# Patient Record
Sex: Female | Born: 1975 | Hispanic: Yes | Marital: Married | Smoking: Former smoker
Health system: Southern US, Community
[De-identification: ages and names within clinical notes are randomized; demographics above are authoritative.]

## PROBLEM LIST (undated history)

## (undated) DIAGNOSIS — G4730 Sleep apnea, unspecified: Secondary | ICD-10-CM

## (undated) DIAGNOSIS — Z6832 Body mass index (BMI) 32.0-32.9, adult: Secondary | ICD-10-CM

## (undated) DIAGNOSIS — F4310 Post-traumatic stress disorder, unspecified: Secondary | ICD-10-CM

## (undated) DIAGNOSIS — Z8619 Personal history of other infectious and parasitic diseases: Secondary | ICD-10-CM

## (undated) DIAGNOSIS — R42 Dizziness and giddiness: Secondary | ICD-10-CM

## (undated) DIAGNOSIS — J45909 Unspecified asthma, uncomplicated: Secondary | ICD-10-CM

## (undated) DIAGNOSIS — F53 Postpartum depression: Secondary | ICD-10-CM

## (undated) DIAGNOSIS — O99345 Other mental disorders complicating the puerperium: Secondary | ICD-10-CM

## (undated) DIAGNOSIS — G43909 Migraine, unspecified, not intractable, without status migrainosus: Secondary | ICD-10-CM

## (undated) DIAGNOSIS — O09529 Supervision of elderly multigravida, unspecified trimester: Secondary | ICD-10-CM

## (undated) DIAGNOSIS — E049 Nontoxic goiter, unspecified: Secondary | ICD-10-CM

## (undated) HISTORY — DX: Migraine, unspecified, not intractable, without status migrainosus: G43.909

## (undated) HISTORY — DX: Supervision of elderly multigravida, unspecified trimester: O09.529

## (undated) HISTORY — DX: Post-traumatic stress disorder, unspecified: F43.10

## (undated) HISTORY — PX: IUD REMOVAL: SHX5392

## (undated) HISTORY — DX: Personal history of other infectious and parasitic diseases: Z86.19

## (undated) HISTORY — PX: HERNIA REPAIR: SHX51

## (undated) HISTORY — DX: Nontoxic goiter, unspecified: E04.9

## (undated) HISTORY — DX: Body mass index (bmi) 32.0-32.9, adult: Z68.32

## (undated) HISTORY — PX: COLONOSCOPY: SHX174

## (undated) HISTORY — PX: SPINAL CORD STIMULATOR IMPLANT: SHX2422

---

## 2000-02-02 DIAGNOSIS — R1110 Vomiting, unspecified: Secondary | ICD-10-CM

## 2003-12-01 ENCOUNTER — Inpatient Hospital Stay: Payer: Self-pay | Admitting: Unknown Physician Specialty

## 2004-05-28 ENCOUNTER — Emergency Department: Payer: Self-pay | Admitting: Emergency Medicine

## 2005-03-16 ENCOUNTER — Ambulatory Visit: Payer: Self-pay | Admitting: Internal Medicine

## 2005-11-12 ENCOUNTER — Emergency Department: Payer: Self-pay | Admitting: Emergency Medicine

## 2009-05-11 ENCOUNTER — Ambulatory Visit: Payer: Self-pay | Admitting: Internal Medicine

## 2010-10-07 ENCOUNTER — Emergency Department: Payer: Self-pay | Admitting: Emergency Medicine

## 2011-05-03 ENCOUNTER — Emergency Department: Payer: Self-pay | Admitting: Emergency Medicine

## 2012-09-22 ENCOUNTER — Ambulatory Visit: Payer: Self-pay | Admitting: Sports Medicine

## 2013-11-30 ENCOUNTER — Emergency Department: Payer: Self-pay | Admitting: Internal Medicine

## 2013-11-30 LAB — URINALYSIS, COMPLETE
Bilirubin,UR: NEGATIVE
Glucose,UR: NEGATIVE mg/dL (ref 0–75)
KETONE: NEGATIVE
Leukocyte Esterase: NEGATIVE
Nitrite: NEGATIVE
PH: 6 (ref 4.5–8.0)
Protein: NEGATIVE
RBC,UR: 1 /HPF (ref 0–5)
Specific Gravity: 1.014 (ref 1.003–1.030)
Squamous Epithelial: 1
WBC UR: 1 /HPF (ref 0–5)

## 2013-11-30 LAB — COMPREHENSIVE METABOLIC PANEL
ALBUMIN: 3.4 g/dL (ref 3.4–5.0)
ALT: 16 U/L
Alkaline Phosphatase: 43 U/L — ABNORMAL LOW
Anion Gap: 7 (ref 7–16)
BUN: 8 mg/dL (ref 7–18)
Bilirubin,Total: 0.5 mg/dL (ref 0.2–1.0)
CALCIUM: 8.3 mg/dL — AB (ref 8.5–10.1)
CO2: 26 mmol/L (ref 21–32)
Chloride: 110 mmol/L — ABNORMAL HIGH (ref 98–107)
Creatinine: 0.71 mg/dL (ref 0.60–1.30)
EGFR (Non-African Amer.): >60
GLUCOSE: 82 mg/dL (ref 65–99)
OSMOLALITY: 282 (ref 275–301)
POTASSIUM: 3.4 mmol/L — AB (ref 3.5–5.1)
SGOT(AST): 12 U/L — ABNORMAL LOW (ref 15–37)
SODIUM: 143 mmol/L (ref 136–145)
Total Protein: 7.7 g/dL (ref 6.4–8.2)

## 2013-11-30 LAB — CBC
HCT: 40.7 % (ref 35.0–47.0)
HGB: 13.5 g/dL (ref 12.0–16.0)
MCH: 30.7 pg (ref 26.0–34.0)
MCHC: 33.1 g/dL (ref 32.0–36.0)
MCV: 93 fL (ref 80–100)
Platelet: 121 10*3/uL — ABNORMAL LOW (ref 150–440)
RBC: 4.38 10*6/uL (ref 3.80–5.20)
RDW: 12.6 % (ref 11.5–14.5)
WBC: 5.6 10*3/uL (ref 3.6–11.0)

## 2013-11-30 LAB — LIPASE, BLOOD: Lipase: 171 U/L (ref 73–393)

## 2013-11-30 LAB — PREGNANCY, URINE: Pregnancy Test, Urine: NEGATIVE m[IU]/mL

## 2013-12-13 ENCOUNTER — Ambulatory Visit: Payer: Self-pay | Admitting: Family Medicine

## 2014-01-23 ENCOUNTER — Emergency Department: Payer: Self-pay | Admitting: Emergency Medicine

## 2014-01-23 LAB — CBC WITH DIFFERENTIAL/PLATELET
Basophil #: 0 10*3/uL (ref 0.0–0.1)
Basophil %: 0.7 %
EOS ABS: 0.2 10*3/uL (ref 0.0–0.7)
Eosinophil %: 3.6 %
HCT: 42.4 % (ref 35.0–47.0)
HGB: 13.9 g/dL (ref 12.0–16.0)
LYMPHS ABS: 1.7 10*3/uL (ref 1.0–3.6)
LYMPHS PCT: 27.5 %
MCH: 30.2 pg (ref 26.0–34.0)
MCHC: 32.7 g/dL (ref 32.0–36.0)
MCV: 92 fL (ref 80–100)
MONOS PCT: 6.5 %
Monocyte #: 0.4 x10 3/mm (ref 0.2–0.9)
NEUTROS ABS: 3.8 10*3/uL (ref 1.4–6.5)
Neutrophil %: 61.7 %
PLATELETS: 149 10*3/uL — AB (ref 150–440)
RBC: 4.6 10*6/uL (ref 3.80–5.20)
RDW: 12.5 % (ref 11.5–14.5)
WBC: 6.2 10*3/uL (ref 3.6–11.0)

## 2014-01-23 LAB — URINALYSIS, COMPLETE
BACTERIA: NONE SEEN
BILIRUBIN, UR: NEGATIVE
GLUCOSE, UR: NEGATIVE mg/dL (ref 0–75)
Ketone: NEGATIVE
Leukocyte Esterase: NEGATIVE
Nitrite: NEGATIVE
PH: 5 (ref 4.5–8.0)
Protein: NEGATIVE
RBC,UR: 1 /HPF (ref 0–5)
SPECIFIC GRAVITY: 1.01 (ref 1.003–1.030)
Squamous Epithelial: 3
WBC UR: <1 /HPF (ref 0–5)

## 2014-01-23 LAB — COMPREHENSIVE METABOLIC PANEL
ALBUMIN: 3.4 g/dL (ref 3.4–5.0)
Alkaline Phosphatase: 41 U/L — ABNORMAL LOW
Anion Gap: 8 (ref 7–16)
BUN: 8 mg/dL (ref 7–18)
Bilirubin,Total: 0.2 mg/dL (ref 0.2–1.0)
Calcium, Total: 8.1 mg/dL — ABNORMAL LOW (ref 8.5–10.1)
Chloride: 108 mmol/L — ABNORMAL HIGH (ref 98–107)
Co2: 22 mmol/L (ref 21–32)
Creatinine: 0.69 mg/dL (ref 0.60–1.30)
EGFR (African American): >60
EGFR (Non-African Amer.): >60
Glucose: 87 mg/dL (ref 65–99)
Osmolality: 273 (ref 275–301)
Potassium: 3.4 mmol/L — ABNORMAL LOW (ref 3.5–5.1)
SGOT(AST): 23 U/L (ref 15–37)
SGPT (ALT): 26 U/L
Sodium: 138 mmol/L (ref 136–145)
TOTAL PROTEIN: 7.5 g/dL (ref 6.4–8.2)

## 2014-01-23 LAB — GC/CHLAMYDIA PROBE AMP

## 2014-01-23 LAB — LIPASE, BLOOD: LIPASE: 209 U/L (ref 73–393)

## 2014-01-23 LAB — WET PREP, GENITAL

## 2014-02-07 ENCOUNTER — Ambulatory Visit: Payer: Self-pay | Admitting: Gastroenterology

## 2014-02-07 HISTORY — PX: ESOPHAGOGASTRODUODENOSCOPY ENDOSCOPY: SHX5814

## 2014-05-20 DIAGNOSIS — E669 Obesity, unspecified: Secondary | ICD-10-CM | POA: Insufficient documentation

## 2014-10-11 DIAGNOSIS — O021 Missed abortion: Secondary | ICD-10-CM

## 2014-10-15 ENCOUNTER — Encounter
Admission: RE | Admit: 2014-10-15 | Discharge: 2014-10-15 | Disposition: A | Discharge status: Home / Self Care | Payer: Medicaid Other | Source: Ambulatory Visit | Attending: Obstetrics and Gynecology | Admitting: Obstetrics and Gynecology

## 2014-10-15 DIAGNOSIS — O021 Missed abortion: Secondary | ICD-10-CM | POA: Diagnosis not present

## 2014-10-15 DIAGNOSIS — Z01818 Encounter for other preprocedural examination: Secondary | ICD-10-CM | POA: Insufficient documentation

## 2014-10-15 HISTORY — DX: Other mental disorders complicating the puerperium: O99.345

## 2014-10-15 HISTORY — DX: Dizziness and giddiness: R42

## 2014-10-15 HISTORY — DX: Unspecified asthma, uncomplicated: J45.909

## 2014-10-15 HISTORY — DX: Postpartum depression: F53.0

## 2014-10-15 LAB — CBC
HCT: 42.1 % (ref 35.0–47.0)
HEMOGLOBIN: 14.2 g/dL (ref 12.0–16.0)
MCH: 30.1 pg (ref 26.0–34.0)
MCHC: 33.7 g/dL (ref 32.0–36.0)
MCV: 89.5 fL (ref 80.0–100.0)
Platelets: 211 10*3/uL (ref 150–440)
RBC: 4.71 MIL/uL (ref 3.80–5.20)
RDW: 12.4 % (ref 11.5–14.5)
WBC: 9 10*3/uL (ref 3.6–11.0)

## 2014-10-15 LAB — ABO/RH: ABO/RH(D): O POS

## 2014-10-15 LAB — TYPE AND SCREEN
ABO/RH(D): O POS
ANTIBODY SCREEN: NEGATIVE

## 2014-10-15 NOTE — Patient Instructions (Signed)
  Your procedure is scheduled on: Thursday 10/16/14 Report to Day Surgery. 2ND FLOOR MEDICAL MALL ENTRANCE AT 3:30 To find out your arrival time please call [phone] between 1PM - 3PM on .  Remember: Instructions that are not followed completely may result in serious medical risk, up to and including death, or upon the discretion of your surgeon and anesthesiologist your surgery may need to be rescheduled.    __X__ 1. Do not eat food or drink liquids after midnight. No gum chewing or hard candies.     __X__ 2. No Alcohol for 24 hours before or after surgery.   ____ 3. Bring all medications with you on the day of surgery if instructed.    __X__ 4. Notify your doctor if there is any change in your medical condition     (cold, fever, infections).     Do not wear jewelry, make-up, hairpins, clips or nail polish.  Do not wear lotions, powders, or perfumes. You may wear deodorant.  Do not shave 48 hours prior to surgery. Men may shave face and neck.  Do not bring valuables to the hospital.    [facility] is not responsible for any belongings or valuables.               Contacts, dentures or bridgework may not be worn into surgery.  Leave your suitcase in the car. After surgery it may be brought to your room.  For patients admitted to the hospital, discharge time is determined by your                treatment team.   Patients discharged the day of surgery will not be allowed to drive home.   Please read over the following fact sheets that you were given:   Surgical Site Infection Prevention   __X__ Take these medicines the morning of surgery with A SIP OF WATER:    1.   2.   3.   4.  5.  6.  ____ Fleet Enema (as directed)   ____ Use CHG Soap as directed  ____ Use inhalers on the day of surgery  ____ Stop metformin 2 days prior to surgery    ____ Take 1/2 of usual insulin dose the night before surgery and none on the morning of surgery.   ____ Stop  Coumadin/Plavix/aspirin on   ____ Stop Anti-inflammatories on    ____ Stop supplements until after surgery.    ____ Bring C-Pap to the hospital.

## 2014-10-16 ENCOUNTER — Encounter: Payer: Self-pay | Admitting: *Deleted

## 2014-10-16 ENCOUNTER — Ambulatory Visit: Payer: Medicaid Other | Admitting: Anesthesiology

## 2014-10-16 ENCOUNTER — Ambulatory Visit
Admission: RE | Admit: 2014-10-16 | Discharge: 2014-10-16 | Disposition: A | Discharge status: Home / Self Care | Payer: Medicaid Other | Source: Ambulatory Visit | Attending: Obstetrics and Gynecology | Admitting: Obstetrics and Gynecology

## 2014-10-16 ENCOUNTER — Encounter
Admission: RE | Disposition: A | Discharge status: Home / Self Care | Payer: Self-pay | Source: Ambulatory Visit | Attending: Obstetrics and Gynecology

## 2014-10-16 DIAGNOSIS — J45909 Unspecified asthma, uncomplicated: Secondary | ICD-10-CM | POA: Diagnosis not present

## 2014-10-16 DIAGNOSIS — K219 Gastro-esophageal reflux disease without esophagitis: Secondary | ICD-10-CM | POA: Diagnosis not present

## 2014-10-16 DIAGNOSIS — Z3A01 Less than 8 weeks gestation of pregnancy: Secondary | ICD-10-CM | POA: Insufficient documentation

## 2014-10-16 DIAGNOSIS — F329 Major depressive disorder, single episode, unspecified: Secondary | ICD-10-CM | POA: Insufficient documentation

## 2014-10-16 DIAGNOSIS — Z9889 Other specified postprocedural states: Secondary | ICD-10-CM

## 2014-10-16 DIAGNOSIS — O021 Missed abortion: Secondary | ICD-10-CM | POA: Insufficient documentation

## 2014-10-16 HISTORY — PX: DILATION AND EVACUATION: SHX1459

## 2014-10-16 SURGERY — DILATION AND EVACUATION, UTERUS
Anesthesia: General | Site: Vagina | Wound class: Clean Contaminated

## 2014-10-16 MED ORDER — DOXYCYCLINE HYCLATE 100 MG PO TABS
200.0000 mg | ORAL_TABLET | Freq: Once | ORAL | Status: AC
  Administered 2014-10-16: 200 mg via ORAL
  Filled 2014-10-16: qty 2

## 2014-10-16 MED ORDER — FAMOTIDINE 20 MG PO TABS
20.0000 mg | ORAL_TABLET | Freq: Once | ORAL | Status: AC
  Administered 2014-10-16: 20 mg via ORAL

## 2014-10-16 MED ORDER — MIDAZOLAM HCL 2 MG/2ML IJ SOLN
INTRAMUSCULAR | Status: DC | PRN
  Administered 2014-10-16 (×4): 1 mg via INTRAVENOUS

## 2014-10-16 MED ORDER — LACTATED RINGERS IV SOLN
INTRAVENOUS | Status: DC
  Administered 2014-10-16 (×2): via INTRAVENOUS

## 2014-10-16 MED ORDER — FENTANYL CITRATE (PF) 100 MCG/2ML IJ SOLN
INTRAMUSCULAR | Status: AC
  Administered 2014-10-16: 25 ug via INTRAVENOUS
  Filled 2014-10-16: qty 2

## 2014-10-16 MED ORDER — FENTANYL CITRATE (PF) 100 MCG/2ML IJ SOLN
INTRAMUSCULAR | Status: DC | PRN
  Administered 2014-10-16: 50 ug via INTRAVENOUS

## 2014-10-16 MED ORDER — OXYCODONE HCL 5 MG PO TABS
5.0000 mg | ORAL_TABLET | Freq: Once | ORAL | Status: AC | PRN
  Administered 2014-10-16: 5 mg via ORAL

## 2014-10-16 MED ORDER — LIDOCAINE HCL (CARDIAC) 20 MG/ML IV SOLN
INTRAVENOUS | Status: DC | PRN
  Administered 2014-10-16: 30 mg via INTRAVENOUS

## 2014-10-16 MED ORDER — OXYCODONE HCL 5 MG/5ML PO SOLN: 5.0000 mg | Freq: Once | ORAL | Status: AC | PRN

## 2014-10-16 MED ORDER — FAMOTIDINE 20 MG PO TABS
ORAL_TABLET | ORAL | Status: AC
Start: 2014-10-16 — End: 2014-10-16
  Administered 2014-10-16: 20 mg via ORAL
  Filled 2014-10-16: qty 1

## 2014-10-16 MED ORDER — OXYCODONE HCL 5 MG PO TABS
ORAL_TABLET | ORAL | Status: AC
  Filled 2014-10-16: qty 1

## 2014-10-16 MED ORDER — FENTANYL CITRATE (PF) 100 MCG/2ML IJ SOLN
25.0000 ug | INTRAMUSCULAR | Status: DC | PRN
  Administered 2014-10-16: 25 ug via INTRAVENOUS
  Administered 2014-10-16: 50 ug via INTRAVENOUS
  Administered 2014-10-16: 25 ug via INTRAVENOUS

## 2014-10-16 MED ORDER — IBUPROFEN 600 MG PO TABS: 600.0000 mg | ORAL_TABLET | Freq: Four times a day (QID) | ORAL | Status: DC | PRN

## 2014-10-16 MED ORDER — DOXYCYCLINE HYCLATE 100 MG IV SOLR
100.0000 mg | Freq: Once | INTRAVENOUS | Status: AC
  Administered 2014-10-16: 100 mg via INTRAVENOUS
  Administered 2014-10-16: 15:00:00 via INTRAVENOUS
  Filled 2014-10-16: qty 100

## 2014-10-16 MED ORDER — PROPOFOL 10 MG/ML IV BOLUS
INTRAVENOUS | Status: DC | PRN
  Administered 2014-10-16: 150 mg via INTRAVENOUS
  Administered 2014-10-16: 50 mg via INTRAVENOUS

## 2014-10-16 MED ORDER — HYDROCODONE-ACETAMINOPHEN 5-325 MG PO TABS: 1.0000 | ORAL_TABLET | Freq: Four times a day (QID) | ORAL | Status: DC | PRN

## 2014-10-16 SURGICAL SUPPLY — 18 items
CATH ROBINSON RED A/P 16FR (CATHETERS) ×3 IMPLANT
FILTER UTR ASPR SPEC (MISCELLANEOUS) ×1 IMPLANT
FLTR UTR ASPR SPEC (MISCELLANEOUS) ×3
GLOVE BIO SURGEON STRL SZ7 (GLOVE) ×6 IMPLANT
GOWN STRL REUS W/ TWL LRG LVL3 (GOWN DISPOSABLE) ×2 IMPLANT
GOWN STRL REUS W/TWL LRG LVL3 (GOWN DISPOSABLE) ×4
KIT BERKELEY 1ST TRIMESTER 3/8 (MISCELLANEOUS) ×3 IMPLANT
KIT RM TURNOVER CYSTO AR (KITS) ×3 IMPLANT
NS IRRIG 500ML POUR BTL (IV SOLUTION) ×3 IMPLANT
PACK DNC HYST (MISCELLANEOUS) ×3 IMPLANT
PAD OB MATERNITY 4.3X12.25 (PERSONAL CARE ITEMS) ×3 IMPLANT
PAD PREP 24X41 OB/GYN DISP (PERSONAL CARE ITEMS) ×3 IMPLANT
SET BERKELEY SUCTION TUBING (SUCTIONS) ×3 IMPLANT
TOWEL OR 17X26 4PK STRL BLUE (TOWEL DISPOSABLE) ×3 IMPLANT
VACURETTE 10 RIGID CVD (CANNULA) IMPLANT
VACURETTE 12 RIGID CVD (CANNULA) IMPLANT
VACURETTE 8 RIGID CVD (CANNULA) IMPLANT
VACURETTE 8MM F TIP (MISCELLANEOUS) ×3 IMPLANT

## 2014-10-16 NOTE — Anesthesia Preprocedure Evaluation (Addendum)
Anesthesia Evaluation  Patient identified by MRN, date of birth, ID band Patient awake    Reviewed: Allergy & Precautions, H&P , NPO status , Patient's Chart, lab work & pertinent test results  Airway Mallampati: II  TM Distance: >3 FB Neck ROM: full    Dental  (+) Poor Dentition   Pulmonary neg shortness of breath, asthma , former smoker,    Pulmonary exam normal breath sounds clear to auscultation       Cardiovascular Exercise Tolerance: Good (-) Past MI negative cardio ROS Normal cardiovascular exam Rhythm:regular Rate:Normal     Neuro/Psych PSYCHIATRIC DISORDERS Depression negative neurological ROS  negative psych ROS   GI/Hepatic Neg liver ROS, GERD  Controlled,  Endo/Other  negative endocrine ROS  Renal/GU negative Renal ROS  negative genitourinary   Musculoskeletal   Abdominal   Peds  Hematology negative hematology ROS (+)   Anesthesia Other Findings Past Medical History:   Asthma                                                       Vertigo - worse this morning per patient which she attributes to be NPO         Post partum depression                                      Reproductive/Obstetrics negative OB ROS                            Anesthesia Physical Anesthesia Plan  ASA: III  Anesthesia Plan: General LMA   Post-op Pain Management:    Induction:   Airway Management Planned:   Additional Equipment:   Intra-op Plan:   Post-operative Plan:   Informed Consent: I have reviewed the patients History and Physical, chart, labs and discussed the procedure including the risks, benefits and alternatives for the proposed anesthesia with the patient or authorized representative who has indicated his/her understanding and acceptance.   Dental Advisory Given  Plan Discussed with: Anesthesiologist, CRNA and Surgeon  Anesthesia Plan Comments:         Anesthesia Quick  Evaluation

## 2014-10-16 NOTE — Op Note (Signed)
Patient Name: Latasha Waters Date of Procedure: 10/16/2014  Preoperative Diagnosis: 1) 39 y.o. with 7 week missed abortion  Postoperative Diagnosis: 1) 39 y.o. with 7 week missed abortion  Operation Performed: Suction dilation and curettage  Indication: 7 week missed abortion choosing surgical management  Anesthesia: General  Primary Surgeon: Vena Austria, MD  Assistant: none  Preoperative Antibiotics: none  Estimated Blood Loss:  IV Fluids: crystaloid  Urine Output:: ~43mL straiggt cath  Drains or Tubes: none  Implants: none  Specimens Removed: products of conception  Complications: none  Intraoperative Findings:  8-10 week size uterus, sounded to 12cm, good uterine cry moderate amount of POC  Patient Condition: stable  Procedure in Detail:  Patient was taken to the operating room were she was administered general endotracheal anesthesia.  She was positioned in the dorsal lithotomy position utilizing Allen stirups, prepped and draped in the usual sterile fashion.  Uterus was noted to be 8-10 weeks in size, anteverted.   Prior to proceeding with the case a time out was performed.  Attention was turned to the patient's pelvis.  A red rubber catheter was used to empty the patient's bladder.  An operative speculum was placed to allow visualization of the cervix.  The anterior lip of the cervix was grasped with a single tooth tenaculum, uterus sounded to 12cm,  and the cervix was sequentially dilated using pratt dilators.  A size 8 flexible suction curette was used to evacuate the uterine cavity utilizing several passes.  Sharp curettage was performed noting good uterine cry.  A final pass of the suction curette was undertaken.  The single tooth tenaculum was removed there cervix and tenaculum sites were hemostatic before removing the operative speculum.  Sponge needle and instrument counts were corrects times two.  The patient tolerated the procedure well and  was taken to the recovery room in stable condition.

## 2014-10-16 NOTE — Transfer of Care (Signed)
Immediate Anesthesia Transfer of Care Note  Patient: Latasha Waters  Procedure(s) Performed: Procedure(s): DILATATION AND EVACUATION (N/A)  Patient Location: PACU  Anesthesia Type:General  Level of Consciousness: awake, oriented and patient cooperative  Airway & Oxygen Therapy: Patient Spontanous Breathing and Patient connected to face mask oxygen  Post-op Assessment: Report given to RN and Post -op Vital signs reviewed and stable  Post vital signs: Reviewed and stable  Last Vitals:  Filed Vitals:   10/16/14 1554  BP: 102/77  Pulse: 72  Temp: 36.2 C  Resp: 17    Complications: No apparent anesthesia complications

## 2014-10-16 NOTE — Discharge Instructions (Addendum)

## 2014-10-16 NOTE — Anesthesia Postprocedure Evaluation (Signed)
  Anesthesia Post-op Note  Patient: Latasha Waters  Procedure(s) Performed: Procedure(s): DILATATION AND EVACUATION (N/A)  Anesthesia type:General LMA  Patient location: PACU  Post pain: Pain level controlled  Post assessment: Post-op Vital signs reviewed, Patient's Cardiovascular Status Stable, Respiratory Function Stable, Patent Airway and No signs of Nausea or vomiting  Post vital signs: Reviewed and stable  Last Vitals:  Filed Vitals:   10/16/14 1750  BP: 119/85  Pulse: 86  Temp: 36.5 C  Resp: 16    Level of consciousness: awake, alert  and patient cooperative  Complications: No apparent anesthesia complications

## 2014-10-17 ENCOUNTER — Encounter: Payer: Self-pay | Admitting: Obstetrics and Gynecology

## 2014-10-21 LAB — SURGICAL PATHOLOGY

## 2016-01-07 ENCOUNTER — Emergency Department
Admission: EM | Admit: 2016-01-07 | Discharge: 2016-01-07 | Disposition: A | Discharge status: Home / Self Care | Payer: Medicaid Other | Attending: Emergency Medicine | Admitting: Emergency Medicine

## 2016-01-07 ENCOUNTER — Encounter: Payer: Self-pay | Admitting: Emergency Medicine

## 2016-01-07 DIAGNOSIS — Z791 Long term (current) use of non-steroidal anti-inflammatories (NSAID): Secondary | ICD-10-CM | POA: Diagnosis not present

## 2016-01-07 DIAGNOSIS — Z3A11 11 weeks gestation of pregnancy: Secondary | ICD-10-CM | POA: Diagnosis not present

## 2016-01-07 DIAGNOSIS — O23591 Infection of other part of genital tract in pregnancy, first trimester: Secondary | ICD-10-CM | POA: Insufficient documentation

## 2016-01-07 DIAGNOSIS — Z87891 Personal history of nicotine dependence: Secondary | ICD-10-CM | POA: Insufficient documentation

## 2016-01-07 DIAGNOSIS — N760 Acute vaginitis: Secondary | ICD-10-CM

## 2016-01-07 DIAGNOSIS — N9489 Other specified conditions associated with female genital organs and menstrual cycle: Secondary | ICD-10-CM | POA: Insufficient documentation

## 2016-01-07 DIAGNOSIS — J45909 Unspecified asthma, uncomplicated: Secondary | ICD-10-CM | POA: Insufficient documentation

## 2016-01-07 DIAGNOSIS — O26891 Other specified pregnancy related conditions, first trimester: Secondary | ICD-10-CM | POA: Diagnosis present

## 2016-01-07 LAB — COMPREHENSIVE METABOLIC PANEL
ALT: 20 U/L (ref 14–54)
AST: 22 U/L (ref 15–41)
Albumin: 3.5 g/dL (ref 3.5–5.0)
Alkaline Phosphatase: 38 U/L (ref 38–126)
Anion gap: 8 (ref 5–15)
BILIRUBIN TOTAL: 0.7 mg/dL (ref 0.3–1.2)
BUN: 7 mg/dL (ref 6–20)
CO2: 21 mmol/L — ABNORMAL LOW (ref 22–32)
Calcium: 8.8 mg/dL — ABNORMAL LOW (ref 8.9–10.3)
Chloride: 105 mmol/L (ref 101–111)
Creatinine, Ser: 0.54 mg/dL (ref 0.44–1.00)
Glucose, Bld: 95 mg/dL (ref 65–99)
POTASSIUM: 3.3 mmol/L — AB (ref 3.5–5.1)
Sodium: 134 mmol/L — ABNORMAL LOW (ref 135–145)
TOTAL PROTEIN: 7.8 g/dL (ref 6.5–8.1)

## 2016-01-07 LAB — URINALYSIS, COMPLETE (UACMP) WITH MICROSCOPIC
BILIRUBIN URINE: NEGATIVE
Glucose, UA: NEGATIVE mg/dL
Ketones, ur: 20 mg/dL — AB
NITRITE: NEGATIVE
PH: 5 (ref 5.0–8.0)
Protein, ur: 100 mg/dL — AB
SPECIFIC GRAVITY, URINE: 1.028 (ref 1.005–1.030)

## 2016-01-07 LAB — POCT PREGNANCY, URINE: Preg Test, Ur: POSITIVE — AB

## 2016-01-07 LAB — WET PREP, GENITAL
Clue Cells Wet Prep HPF POC: NONE SEEN
SPERM: NONE SEEN
Trich, Wet Prep: NONE SEEN
Yeast Wet Prep HPF POC: NONE SEEN

## 2016-01-07 LAB — CBC
HEMATOCRIT: 40.8 % (ref 35.0–47.0)
Hemoglobin: 13.9 g/dL (ref 12.0–16.0)
MCH: 29.1 pg (ref 26.0–34.0)
MCHC: 34.1 g/dL (ref 32.0–36.0)
MCV: 85.5 fL (ref 80.0–100.0)
PLATELETS: 230 10*3/uL (ref 150–440)
RBC: 4.77 MIL/uL (ref 3.80–5.20)
RDW: 12.8 % (ref 11.5–14.5)
WBC: 8.9 10*3/uL (ref 3.6–11.0)

## 2016-01-07 LAB — HCG, QUANTITATIVE, PREGNANCY: hCG, Beta Chain, Quant, S: 130972 m[IU]/mL — ABNORMAL HIGH (ref <5)

## 2016-01-07 MED ORDER — FLUCONAZOLE 50 MG PO TABS
150.0000 mg | ORAL_TABLET | ORAL | Status: AC
  Administered 2016-01-07: 150 mg via ORAL
  Filled 2016-01-07: qty 3

## 2016-01-07 MED ORDER — FLUCONAZOLE 150 MG PO TABS: ORAL_TABLET | ORAL | 0 refills | Status: DC

## 2016-01-07 NOTE — ED Provider Notes (Signed)
[facility] Emergency Department Provider Note  ____________________________________________   First MD Initiated Contact with Patient 01/07/16 1429     (approximate)  I have reviewed the triage vital signs and the nursing notes.   HISTORY  Chief Complaint Pelvic Pain    HPI Latasha Waters is a 40 y.o. female G3 P0 (2 miscarriages) at approximately [redacted] weeks gestation who is followed at [facility] by Dr. Chauncey CruelStabler.  She presents for evaluation of burning and itching of her labia.  She states that it started several days ago as she was completing a course of Keflex for a probable urinary Tract infection.  Been gradually getting worse since that time.  Nothing makes it better and movement and friction make it much worse. It is severely painful.  She denies fever/chills, chest pain, shortness of breath, nausea, vomiting, abdominal pain, vaginal bleeding.  She does have some burning pain when she urinates but that developed after all the sensitivity down in her "private parts".  Past Medical History:  Diagnosis Date  . Asthma   . Post partum depression   . Vertigo     There are no active problems to display for this patient.   Past Surgical History:  Procedure Laterality Date  . COLONOSCOPY    . DILATION AND EVACUATION N/A 10/16/2014   Procedure: DILATATION AND EVACUATION;  Surgeon: Vena AustriaAndreas Staebler, MD;  Location: [facility] ORS;  Service: Gynecology;  Laterality: N/A;  . HERNIA REPAIR    . IUD REMOVAL      Prior to Admission medications   Medication Sig Start Date End Date Taking? Authorizing Provider  fluconazole (DIFLUCAN) 150 MG tablet Take 1 tablet (150 mg) by mouth on 01/09/2016 if you still have symptoms. 01/07/16   Loleta Roseory Beronica Lansdale, MD  HYDROcodone-acetaminophen (NORCO/VICODIN) 5-325 MG tablet Take 1 tablet by mouth every 6 (six) hours as needed. 10/16/14   Vena AustriaAndreas Staebler, MD  ibuprofen (ADVIL,MOTRIN) 600 MG tablet Take 1 tablet (600 mg total) by mouth  every 6 (six) hours as needed. 10/16/14   Vena AustriaAndreas Staebler, MD    Allergies Patient has no known allergies.  No family history on file.  Social History Social History  Substance Use Topics  . Smoking status: Former Games developermoker  . Smokeless tobacco: Never Used  . Alcohol use No    Review of Systems Constitutional: No fever/chills Eyes: No visual changes. ENT: No sore throat. Cardiovascular: Denies chest pain. Respiratory: Denies shortness of breath. Gastrointestinal: No abdominal pain.  No nausea, no vomiting.  No diarrhea.  No constipation. Genitourinary: Negative for dysuria. Musculoskeletal: Negative for back pain. Skin: Negative for rash. Neurological: Negative for headaches, focal weakness or numbness.  10-point ROS otherwise negative.  ____________________________________________   PHYSICAL EXAM:  VITAL SIGNS: ED Triage Vitals  Enc Vitals Group     BP 01/07/16 1314 128/77     Pulse Rate 01/07/16 1314 (!) 104     Resp 01/07/16 1314 20     Temp 01/07/16 1314 98 F (36.7 C)     Temp Source 01/07/16 1314 Oral     SpO2 01/07/16 1314 99 %     Weight 01/07/16 1314 195 lb (88.5 kg)     Height --      Head Circumference --      Peak Flow --      Pain Score 01/07/16 1315 10     Pain Loc --      Pain Edu? --      Excl. in GC? --  Constitutional: Alert and oriented. Well appearing and in no acute distress. Eyes: Conjunctivae are normal. PERRL. EOMI. Head: Atraumatic. Nose: No congestion/rhinnorhea. Mouth/Throat: Mucous membranes are moist.  Oropharynx non-erythematous. Neck: No stridor.  No meningeal signs.   Cardiovascular: Normal rate, regular rhythm. Good peripheral circulation. Grossly normal heart sounds. Respiratory: Normal respiratory effort.  No retractions. Lungs CTAB. Gastrointestinal: Soft and nontender. No distention.  GU:  Red, raw, inflammed, irritated vulvar region with no discrete lesions including no vesicular lesions that would indicate herpes.   The entire area is extremely sensitive to the touch.  No obvious discharge.  Due to the extreme sensitivity I did not attempt a speculum exam but I did obtain a wet prep with gentle insertion of the collection device into the vaginal vault and around on the labia.  Nurse chaperone present throughout exam. Musculoskeletal: No lower extremity tenderness nor edema. No gross deformities of extremities. Neurologic:  Normal speech and language. No gross focal neurologic deficits are appreciated.  Skin:  Skin is warm, dry and intact. No rash noted. Psychiatric: Mood and affect are normal. Speech and behavior are normal.  ____________________________________________   LABS (all labs ordered are listed, but only abnormal results are displayed)  Labs Reviewed  WET PREP, GENITAL - Abnormal; Notable for the following:       Result Value   WBC, Wet Prep HPF POC FEW (*)    All other components within normal limits  COMPREHENSIVE METABOLIC PANEL - Abnormal; Notable for the following:    Sodium 134 (*)    Potassium 3.3 (*)    CO2 21 (*)    Calcium 8.8 (*)    All other components within normal limits  URINALYSIS, COMPLETE (UACMP) WITH MICROSCOPIC - Abnormal; Notable for the following:    Color, Urine AMBER (*)    APPearance CLOUDY (*)    Hgb urine dipstick MODERATE (*)    Ketones, ur 20 (*)    Protein, ur 100 (*)    Leukocytes, UA LARGE (*)    Bacteria, UA MANY (*)    Squamous Epithelial / LPF 6-30 (*)    All other components within normal limits  HCG, QUANTITATIVE, PREGNANCY - Abnormal; Notable for the following:    hCG, Beta Chain, Quant, S 130,972 (*)    All other components within normal limits  POCT PREGNANCY, URINE - Abnormal; Notable for the following:    Preg Test, Ur POSITIVE (*)    All other components within normal limits  URINE CULTURE  CBC  POC URINE PREG, ED   ____________________________________________  EKG  None - EKG not ordered by ED  physician ____________________________________________  RADIOLOGY   No results found.  ____________________________________________   PROCEDURES  Procedure(s) performed:   Procedures   Critical Care performed: No ____________________________________________   INITIAL IMPRESSION / ASSESSMENT AND PLAN / ED COURSE  Pertinent labs & imaging results that were available during my care of the patient were reviewed by me and considered in my medical decision making (see chart for details).  The patient was very concerned about the health of her fetus because of her 2 prior miscarriages.  Fetal heart tones were not able to be obtained by the nurse.  I performed a bedside ultrasound where the patient and I were clearly able to visualize a single intrauterine pregnancy with cardiac activity.  She felt reassured by this.  We will proceed with the workup.   Clinical Course as of Jan 07 2307  Thu Jan 07, 2016  1428  Sending urine culture, patient has reportedly completed a course of Keflex as an outpatient within the last couple of weeks. Leukocytes, UA: (!) LARGE [CF]  1536 I will not treat the urine empirically because I believe that most likely the urine is a contaminant from the inflammation/infection of the vulvovaginitis seen on pelvic exam.  Given the history and physical, I believe it is most likely candidal.  I am awaiting wet prep results to make sure there is not also an indication for Flagyl, but I have ordered fluconazole 150 mg by mouth.  Given the severity of her infection and I believe this is the best course of action because I do not believe she will be able to tolerate vaginal treatments and she may need a second dose of fluconazole in a couple of days if she is still symptomatic for which I will write a prescription.  We will update her after the wet prep results are back  [CF]  1633 Wet prep unremarkable.  Given obvious vulvovaginitis on physical exam, I will proceed with the  plan as described above, and the patient has a follow up appointment already scheduled with Dr. Bonney Aid.  I gave my usual and customary return precautions.  I deferred empiric treatment for the urinalysis given the yeast infection after the last Keflex and given the uncertain results - Dr. Bonney Aid can follow up on the urine culture.  [CF]  1635 Sending message to Dr. Bonney Aid through [facility] to let him know about the management of this patient today.  [CF]    Clinical Course User Index [CF] Loleta Rose, MD    ____________________________________________  FINAL CLINICAL IMPRESSION(S) / ED DIAGNOSES  Final diagnoses:  Vulvovaginitis  [redacted] weeks gestation of pregnancy     MEDICATIONS GIVEN DURING THIS VISIT:  Medications  fluconazole (DIFLUCAN) tablet 150 mg (150 mg Oral Given 01/07/16 1603)     NEW OUTPATIENT MEDICATIONS STARTED DURING THIS VISIT:  Discharge Medication List as of 01/07/2016  4:40 PM    START taking these medications   Details  fluconazole (DIFLUCAN) 150 MG tablet Take 1 tablet (150 mg) by mouth on 01/09/2016 if you still have symptoms., Print        Discharge Medication List as of 01/07/2016  4:40 PM      Discharge Medication List as of 01/07/2016  4:40 PM       Note:  This document was prepared using Dragon voice recognition software and may include unintentional dictation errors.    Loleta Rose, MD 01/07/16 2308

## 2016-01-07 NOTE — ED Notes (Signed)
Pt alert and oriented X4, active, cooperative, pt in NAD. RR even and unlabored, color WNL.  Pt informed to return if any life threatening symptoms occur.   

## 2016-01-07 NOTE — ED Notes (Addendum)
UA Preg result = POSITIVE.

## 2016-01-07 NOTE — Discharge Instructions (Signed)
As we discussed, we used the bedside ultrasound and were able to see your fetus and its beating heart today in the Emergency Department.  Your exam and history sound like you have a yeast infection from the recent antibiotics you took (Keflex) for your urinary tract infection.  We gave you a dose of fluconazole in the Emergency Department and a prescription for another pill that you can take in 2 days if you are still having symptoms.  You can also try using over the counter medication such as Monistat - they have several varieties of creams and ointments that may help relieve your symptoms.  Please follow up with Dr. Bonney AidStaebler at the next available opportunity.

## 2016-01-07 NOTE — ED Notes (Signed)
Says saw obgyn--[facility] --2 week ago for same.  Has irritation and swelling on labia/peri area.  Says they gave her meds without relief.  Says about [redacted] week pregnant.pt in nad.  No abld pain

## 2016-01-07 NOTE — ED Notes (Signed)
CBC collected, sent to lab. 

## 2016-01-07 NOTE — ED Triage Notes (Addendum)
Pt to ed with c/o pelvic burning and pain and constant itching.  Pt states she has been on ABX for an infection of unknown origin. States she would like us to check the baby. Pt is approx [redacted] weeks pregnant.

## 2016-01-08 LAB — URINE CULTURE
CULTURE: NO GROWTH
Special Requests: NORMAL

## 2016-01-11 NOTE — L&D Delivery Note (Signed)
Delivery Note Primary OB: [facility] Delivery Physician: Annamarie MajorPaul Tracye Szuch, MD Gestational Age: Full term Antepartum complications: none Intrapartum complications: None  A viable Female was delivered via vertex perentation.  Apgars:8 ,9  Weight:  pending .   Placenta status: spontaneous and Intact.  Cord: 3+ vessels;  with the following complications: nuchal.  Anesthesia:  epidural Episiotomy:  none Lacerations:  1st Suture Repair: 2.0 vicryl Est. Blood Loss (mL):  200 mL  Mom to postpartum.  Baby to Couplet care / Skin to Skin.  Annamarie MajorPaul Adynn Caseres, MD, Merlinda FrederickFACOG [facility], [facility] 07/20/2016  3:03 PM [phone]

## 2016-01-15 DIAGNOSIS — Z6832 Body mass index (BMI) 32.0-32.9, adult: Secondary | ICD-10-CM

## 2016-01-15 HISTORY — DX: Body mass index (BMI) 32.0-32.9, adult: Z68.32

## 2016-01-15 LAB — OB RESULTS CONSOLE PLATELET COUNT: Platelets: 231 10*3/uL

## 2016-01-15 LAB — OB RESULTS CONSOLE HEPATITIS B SURFACE ANTIGEN: HEP B S AG: NEGATIVE

## 2016-01-15 LAB — OB RESULTS CONSOLE HGB/HCT, BLOOD
HCT: 40 %
Hemoglobin: 13.1 g/dL

## 2016-01-15 LAB — OB RESULTS CONSOLE GC/CHLAMYDIA
Chlamydia: NEGATIVE
Gonorrhea: NEGATIVE

## 2016-01-15 LAB — OB RESULTS CONSOLE RUBELLA ANTIBODY, IGM: RUBELLA: IMMUNE

## 2016-01-15 LAB — OB RESULTS CONSOLE RPR: RPR: NONREACTIVE

## 2016-01-15 LAB — OB RESULTS CONSOLE TSH: TSH: 0.01

## 2016-01-15 LAB — HM PAP SMEAR

## 2016-01-15 LAB — OB RESULTS CONSOLE VARICELLA ZOSTER ANTIBODY, IGG: VARICELLA IGG: IMMUNE

## 2016-01-15 LAB — OB RESULTS CONSOLE HIV ANTIBODY (ROUTINE TESTING): HIV: NONREACTIVE

## 2016-01-15 LAB — SICKLE CELL SCREEN: Sickle Cell Screen: NORMAL

## 2016-02-22 ENCOUNTER — Other Ambulatory Visit: Payer: Self-pay | Admitting: Obstetrics and Gynecology

## 2016-02-22 DIAGNOSIS — Z369 Encounter for antenatal screening, unspecified: Secondary | ICD-10-CM

## 2016-03-04 ENCOUNTER — Ambulatory Visit
Admission: RE | Admit: 2016-03-04 | Discharge: 2016-03-04 | Disposition: A | Discharge status: Home / Self Care | Payer: Medicaid Other | Source: Ambulatory Visit | Attending: Obstetrics and Gynecology | Admitting: Obstetrics and Gynecology

## 2016-03-04 DIAGNOSIS — Z369 Encounter for antenatal screening, unspecified: Secondary | ICD-10-CM | POA: Diagnosis present

## 2016-03-04 DIAGNOSIS — Z3A19 19 weeks gestation of pregnancy: Secondary | ICD-10-CM | POA: Diagnosis not present

## 2016-03-23 ENCOUNTER — Telehealth: Payer: Self-pay

## 2016-03-23 MED ORDER — PROVIDA OB 20-20-1.25 MG PO CAPS: 1.0000 | ORAL_CAPSULE | Freq: Every day | ORAL | 11 refills | Status: AC

## 2016-03-23 MED ORDER — OSELTAMIVIR PHOSPHATE 75 MG PO CAPS: 75.0000 mg | ORAL_CAPSULE | Freq: Every day | ORAL | 0 refills | Status: AC

## 2016-03-23 NOTE — Telephone Encounter (Signed)
Pt states her son was diagnosed w/flu today & his pediatrician recommended her contact us for preventative meds. Also patient [facility]HA [facility] [place].

## 2016-03-23 NOTE — Telephone Encounter (Signed)
Pt aware.

## 2016-03-23 NOTE — Telephone Encounter (Signed)
Tamilfu and PNV sent

## 2016-03-23 NOTE — Telephone Encounter (Signed)
Please advise 

## 2016-03-23 NOTE — Telephone Encounter (Signed)
Pt calling about questions regarding the flu. ZH#[phone]#[phone]

## 2016-04-05 ENCOUNTER — Encounter: Payer: Self-pay | Admitting: Certified Nurse Midwife

## 2016-04-05 ENCOUNTER — Ambulatory Visit (INDEPENDENT_AMBULATORY_CARE_PROVIDER_SITE_OTHER): Payer: Medicaid Other | Admitting: Certified Nurse Midwife

## 2016-04-05 VITALS — BP 80/60 | Wt 197.0 lb

## 2016-04-05 DIAGNOSIS — O99282 Endocrine, nutritional and metabolic diseases complicating pregnancy, second trimester: Secondary | ICD-10-CM

## 2016-04-05 DIAGNOSIS — E0590 Thyrotoxicosis, unspecified without thyrotoxic crisis or storm: Secondary | ICD-10-CM

## 2016-04-05 DIAGNOSIS — O26812 Pregnancy related exhaustion and fatigue, second trimester: Secondary | ICD-10-CM | POA: Diagnosis not present

## 2016-04-05 DIAGNOSIS — O09529 Supervision of elderly multigravida, unspecified trimester: Secondary | ICD-10-CM

## 2016-04-05 DIAGNOSIS — Z139 Encounter for screening, unspecified: Secondary | ICD-10-CM

## 2016-04-05 DIAGNOSIS — Z3A23 23 weeks gestation of pregnancy: Secondary | ICD-10-CM | POA: Diagnosis not present

## 2016-04-05 LAB — POCT HEMOGLOBIN: HEMOGLOBIN: 11.4 g/dL — AB (ref 12.2–16.2)

## 2016-04-05 LAB — THYROXINE (T4) FREE, DIRECT, S
T4,Free (Direct): 1.83
TRIIODOTHYRONINE, FREE, SERUM: 5.1

## 2016-04-05 LAB — HPV APTIMA: HPV APTIMA: NEGATIVE

## 2016-04-05 NOTE — Progress Notes (Signed)
Super tired, co's of ha, vertigo,  and nausea/vomiting yesterday with exhaustion to the point she could not get out of bed or perform daily activities.  Today co's of palpitations this am upon waking and feels that she is having trouble walking and talking at the same time. Could not go to school yesterday. Need note for school. Yesterday was the first time in 3 weeks that she was nauseated and vomited. Weight unchanged since last visit. Awaiting to hear from endocrinologist regarding follow up of hyperthyroidism. Good FM. O2 sat today: 98%. Hmg 11.4 gm/dl. Lungs: CTA. Heart: RRR without murmur Encouraged patient to call endocrinology if she does not hear from them this week. Increase fluids today. ROB, growth scan and 28 week labs NV.  Farrel Connersolleen Edmund Holcomb, CNM

## 2016-04-17 ENCOUNTER — Encounter: Payer: Self-pay | Admitting: Certified Nurse Midwife

## 2016-04-17 DIAGNOSIS — E0590 Thyrotoxicosis, unspecified without thyrotoxic crisis or storm: Secondary | ICD-10-CM | POA: Insufficient documentation

## 2016-04-17 DIAGNOSIS — O99280 Endocrine, nutritional and metabolic diseases complicating pregnancy, unspecified trimester: Secondary | ICD-10-CM

## 2016-04-17 DIAGNOSIS — O0990 Supervision of high risk pregnancy, unspecified, unspecified trimester: Secondary | ICD-10-CM | POA: Insufficient documentation

## 2016-05-03 ENCOUNTER — Ambulatory Visit: Payer: Medicaid Other | Admitting: Obstetrics and Gynecology

## 2016-05-03 ENCOUNTER — Ambulatory Visit (INDEPENDENT_AMBULATORY_CARE_PROVIDER_SITE_OTHER): Payer: Medicaid Other

## 2016-05-03 ENCOUNTER — Other Ambulatory Visit: Payer: Medicaid Other

## 2016-05-03 VITALS — BP 108/66 | Wt 201.0 lb

## 2016-05-03 DIAGNOSIS — O09529 Supervision of elderly multigravida, unspecified trimester: Secondary | ICD-10-CM | POA: Diagnosis not present

## 2016-05-03 DIAGNOSIS — O09522 Supervision of elderly multigravida, second trimester: Secondary | ICD-10-CM

## 2016-05-03 DIAGNOSIS — O99519 Diseases of the respiratory system complicating pregnancy, unspecified trimester: Secondary | ICD-10-CM

## 2016-05-03 DIAGNOSIS — R102 Pelvic and perineal pain: Secondary | ICD-10-CM

## 2016-05-03 DIAGNOSIS — E0590 Thyrotoxicosis, unspecified without thyrotoxic crisis or storm: Secondary | ICD-10-CM

## 2016-05-03 DIAGNOSIS — O99282 Endocrine, nutritional and metabolic diseases complicating pregnancy, second trimester: Secondary | ICD-10-CM

## 2016-05-03 DIAGNOSIS — Z3A27 27 weeks gestation of pregnancy: Secondary | ICD-10-CM

## 2016-05-03 DIAGNOSIS — J45909 Unspecified asthma, uncomplicated: Secondary | ICD-10-CM | POA: Insufficient documentation

## 2016-05-03 DIAGNOSIS — O26892 Other specified pregnancy related conditions, second trimester: Secondary | ICD-10-CM

## 2016-05-03 MED ORDER — MONTELUKAST SODIUM 10 MG PO TABS: 10.0000 mg | ORAL_TABLET | Freq: Every day | ORAL | 6 refills | Status: AC

## 2016-05-03 MED ORDER — ALBUTEROL SULFATE HFA 108 (90 BASE) MCG/ACT IN AERS: 1.0000 | INHALATION_SPRAY | RESPIRATORY_TRACT | 3 refills | Status: AC | PRN

## 2016-05-03 NOTE — Addendum Note (Signed)
Addended by: Lorrene Reid on: 05/03/2016 11:30 AM   Modules accepted: Orders

## 2016-05-03 NOTE — Patient Instructions (Signed)
Third Trimester of Pregnancy The third trimester is from week 28 through week 40 (months 7 through 9). The third trimester is a time when the unborn baby (fetus) is growing rapidly. At the end of the ninth month, the fetus is about 20 inches in length and weighs 6-10 pounds. Body changes during your third trimester Your body will continue to go through many changes during pregnancy. The changes vary from woman to woman. During the third trimester:  Your weight will continue to increase. You can expect to gain 25-35 pounds (11-16 kg) by the end of the pregnancy.  You may begin to get stretch marks on your hips, abdomen, and breasts.  You may urinate more often because the fetus is moving lower into your pelvis and pressing on your bladder.  You may develop or continue to have heartburn. This is caused by increased hormones that slow down muscles in the digestive tract.  You may develop or continue to have constipation because increased hormones slow digestion and cause the muscles that push waste through your intestines to relax.  You may develop hemorrhoids. These are swollen veins (varicose veins) in the rectum that can itch or be painful.  You may develop swollen, bulging veins (varicose veins) in your legs.  You may have increased body aches in the pelvis, back, or thighs. This is due to weight gain and increased hormones that are relaxing your joints.  You may have changes in your hair. These can include thickening of your hair, rapid growth, and changes in texture. Some women also have hair loss during or after pregnancy, or hair that feels dry or thin. Your hair will most likely return to normal after your baby is born.  Your breasts will continue to grow and they will continue to become tender. A yellow fluid (colostrum) may leak from your breasts. This is the first milk you are producing for your baby.  Your belly button may stick out.  You may notice more swelling in your hands,  face, or ankles.  You may have increased tingling or numbness in your hands, arms, and legs. The skin on your belly may also feel numb.  You may feel short of breath because of your expanding uterus.  You may have more problems sleeping. This can be caused by the size of your belly, increased need to urinate, and an increase in your body's metabolism.  You may notice the fetus "dropping," or moving lower in your abdomen (lightening).  You may have increased vaginal discharge.  You may notice your joints feel loose and you may have pain around your pelvic bone.  What to expect at prenatal visits You will have prenatal exams every 2 weeks until week 36. Then you will have weekly prenatal exams. During a routine prenatal visit:  You will be weighed to make sure you and the baby are growing normally.  Your blood pressure will be taken.  Your abdomen will be measured to track your baby's growth.  The fetal heartbeat will be listened to.  Any test results from the previous visit will be discussed.  You may have a cervical check near your due date to see if your cervix has softened or thinned (effaced).  You will be tested for Group B streptococcus. This happens between 35 and 37 weeks.  Your health care provider may ask you:  What your birth plan is.  How you are feeling.  If you are feeling the baby move.  If you have had   any abnormal symptoms, such as leaking fluid, bleeding, severe headaches, or abdominal cramping.  If you are using any tobacco products, including cigarettes, chewing tobacco, and electronic cigarettes.  If you have any questions.  Other tests or screenings that may be performed during your third trimester include:  Blood tests that check for low iron levels (anemia).  Fetal testing to check the health, activity level, and growth of the fetus. Testing is done if you have certain medical conditions or if there are problems during the  pregnancy.  Nonstress test (NST). This test checks the health of your baby to make sure there are no signs of problems, such as the baby not getting enough oxygen. During this test, a belt is placed around your belly. The baby is made to move, and its heart rate is monitored during movement.  What is false labor? False labor is a condition in which you feel small, irregular tightenings of the muscles in the womb (contractions) that usually go away with rest, changing position, or drinking water. These are called Braxton Hicks contractions. Contractions may last for hours, days, or even weeks before true labor sets in. If contractions come at regular intervals, become more frequent, increase in intensity, or become painful, you should see your health care provider. What are the signs of labor?  Abdominal cramps.  Regular contractions that start at 10 minutes apart and become stronger and more frequent with time.  Contractions that start on the top of the uterus and spread down to the lower abdomen and back.  Increased pelvic pressure and dull back pain.  A watery or bloody mucus discharge that comes from the vagina.  Leaking of amniotic fluid. This is also known as your "water breaking." It could be a slow trickle or a gush. Let your health care provider know if it has a color or strange odor. If you have any of these signs, call your health care provider right away, even if it is before your due date. Follow these instructions at home: Medicines  Follow your health care provider's instructions regarding medicine use. Specific medicines may be either safe or unsafe to take during pregnancy.  Take a prenatal vitamin that contains at least 600 micrograms (mcg) of folic acid.  If you develop constipation, try taking a stool softener if your health care provider approves. Eating and drinking  Eat a balanced diet that includes fresh fruits and vegetables, whole grains, good sources of protein  such as meat, eggs, or tofu, and low-fat dairy. Your health care provider will help you determine the amount of weight gain that is right for you.  Avoid raw meat and uncooked cheese. These carry germs that can cause birth defects in the baby.  If you have low calcium intake from food, talk to your health care provider about whether you should take a daily calcium supplement.  Eat four or five small meals rather than three large meals a day.  Limit foods that are high in fat and processed sugars, such as fried and sweet foods.  To prevent constipation: ? Drink enough fluid to keep your urine clear or pale yellow. ? Eat foods that are high in fiber, such as fresh fruits and vegetables, whole grains, and beans. Activity  Exercise only as directed by your health care provider. Most women can continue their usual exercise routine during pregnancy. Try to exercise for 30 minutes at least 5 days a week. Stop exercising if you experience uterine contractions.  Avoid heavy   lifting.  Do not exercise in extreme heat or humidity, or at high altitudes.  Wear low-heel, comfortable shoes.  Practice good posture.  You may continue to have sex unless your health care provider tells you otherwise. Relieving pain and discomfort  Take frequent breaks and rest with your legs elevated if you have leg cramps or low back pain.  Take warm sitz baths to soothe any pain or discomfort caused by hemorrhoids. Use hemorrhoid cream if your health care provider approves.  Wear a good support bra to prevent discomfort from breast tenderness.  If you develop varicose veins: ? Wear support pantyhose or compression stockings as told by your healthcare provider. ? Elevate your feet for 15 minutes, 3-4 times a day. Prenatal care  Write down your questions. Take them to your prenatal visits.  Keep all your prenatal visits as told by your health care provider. This is important. Safety  Wear your seat belt at  all times when driving.  Make a list of emergency phone numbers, including numbers for family, friends, the hospital, and police and fire departments. General instructions  Avoid cat litter boxes and soil used by cats. These carry germs that can cause birth defects in the baby. If you have a cat, ask someone to clean the litter box for you.  Do not travel far distances unless it is absolutely necessary and only with the approval of your health care provider.  Do not use hot tubs, steam rooms, or saunas.  Do not drink alcohol.  Do not use any products that contain nicotine or tobacco, such as cigarettes and e-cigarettes. If you need help quitting, ask your health care provider.  Do not use any medicinal herbs or unprescribed drugs. These chemicals affect the formation and growth of the baby.  Do not douche or use tampons or scented sanitary pads.  Do not cross your legs for long periods of time.  To prepare for the arrival of your baby: ? Take prenatal classes to understand, practice, and ask questions about labor and delivery. ? Make a trial run to the hospital. ? Visit the hospital and tour the maternity area. ? Arrange for maternity or paternity leave through employers. ? Arrange for family and friends to take care of pets while you are in the hospital. ? Purchase a rear-facing car seat and make sure you know how to install it in your car. ? Pack your hospital bag. ? Prepare the baby's nursery. Make sure to remove all pillows and stuffed animals from the baby's crib to prevent suffocation.  Visit your dentist if you have not gone during your pregnancy. Use a soft toothbrush to brush your teeth and be gentle when you floss. Contact a health care provider if:  You are unsure if you are in labor or if your water has broken.  You become dizzy.  You have mild pelvic cramps, pelvic pressure, or nagging pain in your abdominal area.  You have lower back pain.  You have persistent  nausea, vomiting, or diarrhea.  You have an unusual or bad smelling vaginal discharge.  You have pain when you urinate. Get help right away if:  Your water breaks before 37 weeks.  You have regular contractions less than 5 minutes apart before 37 weeks.  You have a fever.  You are leaking fluid from your vagina.  You have spotting or bleeding from your vagina.  You have severe abdominal pain or cramping.  You have rapid weight loss or weight gain.    You have shortness of breath with chest pain.  You notice sudden or extreme swelling of your face, hands, ankles, feet, or legs.  Your baby makes fewer than 10 movements in 2 hours.  You have severe headaches that do not go away when you take medicine.  You have vision changes. Summary  The third trimester is from week 28 through week 40, months 7 through 9. The third trimester is a time when the unborn baby (fetus) is growing rapidly.  During the third trimester, your discomfort may increase as you and your baby continue to gain weight. You may have abdominal, leg, and back pain, sleeping problems, and an increased need to urinate.  During the third trimester your breasts will keep growing and they will continue to become tender. A yellow fluid (colostrum) may leak from your breasts. This is the first milk you are producing for your baby.  False labor is a condition in which you feel small, irregular tightenings of the muscles in the womb (contractions) that eventually go away. These are called Braxton Hicks contractions. Contractions may last for hours, days, or even weeks before true labor sets in.  Signs of labor can include: abdominal cramps; regular contractions that start at 10 minutes apart and become stronger and more frequent with time; watery or bloody mucus discharge that comes from the vagina; increased pelvic pressure and dull back pain; and leaking of amniotic fluid. This information is not intended to replace advice  given to you by your health care provider. Make sure you discuss any questions you have with your health care provider. Document Released: 12/21/2000 Document Revised: 06/04/2015 Document Reviewed: 02/28/2012 Elsevier Interactive Patient Education  2017 [employer]  

## 2016-05-03 NOTE — Addendum Note (Signed)
Addended by: Lorrene Reid on: 05/03/2016 11:26 AM   Modules accepted: Orders

## 2016-05-03 NOTE — Progress Notes (Signed)
Pain in low pelvic area radiates to left side/allergies otc meds not working/SOB/Palpitations

## 2016-05-03 NOTE — Progress Notes (Signed)
Some pelvic girdle symptoms with pain in the left sacroiliac joint.  Heat and tylenol will refer to PT.  Also rx for singulair and albuterol for asthma symptoms

## 2016-05-04 LAB — 28 WEEK RH+PANEL
Basophils Absolute: 0 10*3/uL (ref 0.0–0.2)
Basos: 0 %
EOS (ABSOLUTE): 0.4 10*3/uL (ref 0.0–0.4)
Eos: 4 %
GESTATIONAL DIABETES SCREEN: 139 mg/dL (ref 65–139)
HEMATOCRIT: 33.8 % — AB (ref 34.0–46.6)
HEMOGLOBIN: 11.6 g/dL (ref 11.1–15.9)
HIV Screen 4th Generation wRfx: NONREACTIVE
IMMATURE GRANS (ABS): 0 10*3/uL (ref 0.0–0.1)
Immature Granulocytes: 0 %
LYMPHS ABS: 1.6 10*3/uL (ref 0.7–3.1)
LYMPHS: 16 %
MCH: 29.6 pg (ref 26.6–33.0)
MCHC: 34.3 g/dL (ref 31.5–35.7)
MCV: 86 fL (ref 79–97)
MONOS ABS: 0.4 10*3/uL (ref 0.1–0.9)
Monocytes: 4 %
NEUTROS ABS: 7.9 10*3/uL — AB (ref 1.4–7.0)
Neutrophils: 76 %
Platelets: 235 10*3/uL (ref 150–379)
RBC: 3.92 x10E6/uL (ref 3.77–5.28)
RDW: 13.6 % (ref 12.3–15.4)
RPR: NONREACTIVE
WBC: 10.4 10*3/uL (ref 3.4–10.8)

## 2016-05-17 ENCOUNTER — Encounter: Payer: Medicaid Other | Admitting: Obstetrics and Gynecology

## 2016-05-18 ENCOUNTER — Ambulatory Visit: Payer: Medicaid Other | Attending: Obstetrics and Gynecology | Admitting: Physical Therapy

## 2016-05-18 DIAGNOSIS — M545 Low back pain, unspecified: Secondary | ICD-10-CM

## 2016-05-18 DIAGNOSIS — M5440 Lumbago with sciatica, unspecified side: Secondary | ICD-10-CM | POA: Diagnosis present

## 2016-05-18 DIAGNOSIS — M6281 Muscle weakness (generalized): Secondary | ICD-10-CM | POA: Diagnosis present

## 2016-05-18 DIAGNOSIS — O26893 Other specified pregnancy related conditions, third trimester: Secondary | ICD-10-CM | POA: Diagnosis not present

## 2016-05-18 DIAGNOSIS — R29898 Other symptoms and signs involving the musculoskeletal system: Secondary | ICD-10-CM | POA: Insufficient documentation

## 2016-05-18 NOTE — Patient Instructions (Signed)
PELVIC FLOOR / KEGEL EXERCISES   Pelvic floor/ Kegel exercises are used to strengthen the muscles in the base of your pelvis that are responsible for supporting your pelvic organs and preventing urine/feces leakage. Based on your therapist's recommendations, they can be performed while standing, sitting, or lying down. Imagine pelvic floor area as a diamond with pelvic landmarks: top =pubic bone, bottom tip=tailbone, sides=sitting bones (ischial tuberosities).    Make yourself aware of this muscle group by using these cues while coordinating your breath:  Inhale, feel pelvic floor diamond area lower like hammock towards your feet and ribcage/belly expanding. Pause. Let the exhale naturally and feel your belly sink, abdominal muscles hugging in around you and you may notice the pelvic diamond draws upward towards your head forming a umbrella shape. Give a squeeze during the exhalation like you are stopping the flow of urine. If you are squeezing the buttock muscles, try to give 50% less effort.   Common Errors:  Breath holding: If you are holding your breath, you may be bearing down against your bladder instead of pulling it up. If you belly bulges up while you are squeezing, you are holding your breath. Be sure to breathe gently in and out while exercising. Counting out loud may help you avoid holding your breath.  Accessory muscle use: You should not see or feel other muscle movement when performing pelvic floor exercises. When done properly, no one can tell that you are performing the exercises. Keep the buttocks, belly and inner thighs relaxed.  Overdoing it: Your muscles can fatigue and stop working for you if you over-exercise. You may actually leak more or feel soreness at the lower abdomen or rectum.  YOUR HOME EXERCISE PROGRAM     SHORT HOLDS: Position: on back, sitting   Inhale and then exhale. Then squeeze the muscle.  (Be sure to let belly sink in with exhales and not push  outward)  Perform 5 repetitions, 5  Times/day   LONG HOLDS: Position: on back  Inhale and then exhale. Then squeeze the muscle and count aloud for 10 seconds. Rest with three long breaths. (Be sure to let belly sink in with exhales and not push outward)  Perform 5 repetitions, 3 times/day                    DECREASE DOWNWARD PRESSURE ON  YOUR PELVIC FLOOR, ABDOMINAL, LOW BACK MUSCLES       PRESERVE YOUR PELVIC HEALTH LONG-TERM   ** SQUEEZE pelvic floor BEFORE YOUR SNEEZE, COUGH, LAUGH   ** EXHALE BEFORE YOU RISE AGAINST GRAVITY (lifting, sit to stand, from squat to stand)   ** LOG ROLL OUT OF BED INSTEAD OF CRUNCH/SIT-UP    _________  Belt during the day and night    _________   Make sure when sitting, feet are under your knees on the floor, slight curve in your low back with a pelvic tilt   Seated exercise:   press the thigh out into the the hands - count aloud for 5  Rest  repeat for 10 reps    _______
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
## 2016-05-25 ENCOUNTER — Ambulatory Visit (INDEPENDENT_AMBULATORY_CARE_PROVIDER_SITE_OTHER): Payer: Medicaid Other | Admitting: Obstetrics and Gynecology

## 2016-05-25 VITALS — BP 100/60 | Wt 198.0 lb

## 2016-05-25 DIAGNOSIS — Z3A30 30 weeks gestation of pregnancy: Secondary | ICD-10-CM

## 2016-05-25 DIAGNOSIS — O0990 Supervision of high risk pregnancy, unspecified, unspecified trimester: Secondary | ICD-10-CM

## 2016-05-25 DIAGNOSIS — O99519 Diseases of the respiratory system complicating pregnancy, unspecified trimester: Secondary | ICD-10-CM

## 2016-05-25 DIAGNOSIS — O09523 Supervision of elderly multigravida, third trimester: Secondary | ICD-10-CM

## 2016-05-25 DIAGNOSIS — Z23 Encounter for immunization: Secondary | ICD-10-CM | POA: Diagnosis not present

## 2016-05-25 DIAGNOSIS — J45909 Unspecified asthma, uncomplicated: Secondary | ICD-10-CM

## 2016-05-25 NOTE — Progress Notes (Signed)
TDAP today 

## 2016-06-10 ENCOUNTER — Ambulatory Visit (INDEPENDENT_AMBULATORY_CARE_PROVIDER_SITE_OTHER): Payer: Medicaid Other

## 2016-06-10 ENCOUNTER — Ambulatory Visit (INDEPENDENT_AMBULATORY_CARE_PROVIDER_SITE_OTHER): Payer: Medicaid Other | Admitting: Obstetrics and Gynecology

## 2016-06-10 VITALS — BP 102/78 | HR 109 | Wt 197.0 lb

## 2016-06-10 DIAGNOSIS — O99519 Diseases of the respiratory system complicating pregnancy, unspecified trimester: Secondary | ICD-10-CM

## 2016-06-10 DIAGNOSIS — O09523 Supervision of elderly multigravida, third trimester: Secondary | ICD-10-CM | POA: Diagnosis not present

## 2016-06-10 DIAGNOSIS — N3000 Acute cystitis without hematuria: Secondary | ICD-10-CM

## 2016-06-10 DIAGNOSIS — O99283 Endocrine, nutritional and metabolic diseases complicating pregnancy, third trimester: Secondary | ICD-10-CM

## 2016-06-10 DIAGNOSIS — E0590 Thyrotoxicosis, unspecified without thyrotoxic crisis or storm: Secondary | ICD-10-CM

## 2016-06-10 DIAGNOSIS — O0990 Supervision of high risk pregnancy, unspecified, unspecified trimester: Secondary | ICD-10-CM

## 2016-06-10 DIAGNOSIS — Z3A30 30 weeks gestation of pregnancy: Secondary | ICD-10-CM | POA: Diagnosis not present

## 2016-06-10 DIAGNOSIS — Z362 Encounter for other antenatal screening follow-up: Secondary | ICD-10-CM | POA: Diagnosis not present

## 2016-06-10 DIAGNOSIS — Z3A32 32 weeks gestation of pregnancy: Secondary | ICD-10-CM

## 2016-06-10 DIAGNOSIS — J45909 Unspecified asthma, uncomplicated: Secondary | ICD-10-CM

## 2016-06-10 MED ORDER — NITROFURANTOIN MONOHYD MACRO 100 MG PO CAPS: 100.0000 mg | ORAL_CAPSULE | Freq: Two times a day (BID) | ORAL | 0 refills | Status: AC

## 2016-06-10 NOTE — Progress Notes (Signed)
4lbs 6oz or 46%ile today on growth, AFI 16.  Rx macrobid and urine culture sent

## 2016-06-10 NOTE — Progress Notes (Signed)
Tired/tries to  Eat but has uppergastric pain after little food/drink Urine dark/long dip Large Nitrites/2+ protein Not sleeping

## 2016-06-12 LAB — URINE CULTURE

## 2016-06-27 ENCOUNTER — Ambulatory Visit (INDEPENDENT_AMBULATORY_CARE_PROVIDER_SITE_OTHER): Payer: Medicaid Other | Admitting: Advanced Practice Midwife

## 2016-06-27 ENCOUNTER — Telehealth: Payer: Self-pay | Admitting: Obstetrics and Gynecology

## 2016-06-27 VITALS — BP 110/70 | Wt 200.0 lb

## 2016-06-27 DIAGNOSIS — Z3689 Encounter for other specified antenatal screening: Secondary | ICD-10-CM

## 2016-06-27 DIAGNOSIS — Z3A34 34 weeks gestation of pregnancy: Secondary | ICD-10-CM

## 2016-06-27 NOTE — Progress Notes (Signed)
Patient admits good fetal movement. She denies ctx, LOF, VB. She is unhappy today due to scheduling. She thought she would be seen by Dr Bonney AidStaebler. Growth scan nv.

## 2016-07-11 ENCOUNTER — Ambulatory Visit (INDEPENDENT_AMBULATORY_CARE_PROVIDER_SITE_OTHER): Payer: Medicaid Other

## 2016-07-11 ENCOUNTER — Ambulatory Visit (INDEPENDENT_AMBULATORY_CARE_PROVIDER_SITE_OTHER): Payer: Medicaid Other | Admitting: Obstetrics and Gynecology

## 2016-07-11 ENCOUNTER — Encounter: Payer: Self-pay | Admitting: Obstetrics and Gynecology

## 2016-07-11 VITALS — BP 100/76 | Wt 201.0 lb

## 2016-07-11 DIAGNOSIS — E0590 Thyrotoxicosis, unspecified without thyrotoxic crisis or storm: Secondary | ICD-10-CM | POA: Diagnosis not present

## 2016-07-11 DIAGNOSIS — O99283 Endocrine, nutritional and metabolic diseases complicating pregnancy, third trimester: Secondary | ICD-10-CM

## 2016-07-11 DIAGNOSIS — Z3685 Encounter for antenatal screening for Streptococcus B: Secondary | ICD-10-CM

## 2016-07-11 DIAGNOSIS — Z3689 Encounter for other specified antenatal screening: Secondary | ICD-10-CM

## 2016-07-11 DIAGNOSIS — O0990 Supervision of high risk pregnancy, unspecified, unspecified trimester: Secondary | ICD-10-CM

## 2016-07-11 DIAGNOSIS — Z3A36 36 weeks gestation of pregnancy: Secondary | ICD-10-CM | POA: Diagnosis not present

## 2016-07-11 DIAGNOSIS — O09523 Supervision of elderly multigravida, third trimester: Secondary | ICD-10-CM

## 2016-07-11 DIAGNOSIS — E669 Obesity, unspecified: Secondary | ICD-10-CM | POA: Diagnosis not present

## 2016-07-11 NOTE — Progress Notes (Signed)
    Routine Prenatal Care Visit  Subjective  Fetal Movement? yes Contractions? no Leaking Fluid? no Vaginal Bleeding? no  Objective   Clinic [facility] Prenatal Labs  Dating 6week ultrasound Blood type:   O POS  Genetic Screen 1 Screen:    AFP:     Quad:     NIPS: nl XY Antibody: negative  Anatomic US  Rubella: Immune (01/05 0000)  GTT Early:               Third trimester:  RPR: Nonreactive (01/05 0000)   Flu vaccine  HBsAg: Negative (01/05 0000)   TDaP vaccine  05/25/16                                          Rhogam: N/A HIV: Non-reactive (01/05 0000)   Baby Food Breast & Bottle                                        GBS GBS bacteruria  Contraception BTL  Pap: NIL/ neg HRHPV on 01/15/16  Circumcision  Varicella: immune  Pediatrician  AA hgb  Growth Scan 07/11/16 36 weeks 3228g 7lbs2oz, 66.3%ile        Vitals:   07/11/16 0941  BP: 100/76    Urine dipstick shows negative for all components.  General: NAD Pumonary: no increased work of breathing Abdomen: gravid, non-tender, fundal height 37  GU: FTP/50/hi Extremities: no edema Psychiatric: mood appropriate, affect full   Assessment  Baseline: 145 Variability: moderate Accelerations: present Decelerations: absent Tocometry: N/A The patient was monitored for 30 minutes, fetal heart rate tracing was deemed reactive, category I tracing,  41 y.o. [id number] at 2262w6d by  08/02/2016, by Ultrasound presenting for routine prenatal visit  pregnancy 6 Problems (from 04/05/16 to present)    Problem Noted Resolved   Asthma affecting pregnancy, antepartum 05/03/2016 by Vena AustriaStaebler, Kieran Nachtigal, MD No       Plan   Problem List Items Addressed This Visit      Endocrine   Hyperthyroidism affecting pregnancy     Other   High risk pregnancy, antepartum   Relevant Orders   Strep Gp B NAA   US FETAL BPP WO NON STRESS   Advanced maternal age in multigravida, third trimester - Primary   Relevant Orders   US FETAL BPP WO NON STRESS   Mild  obesity    Other Visit Diagnoses    [redacted] weeks gestation of pregnancy       Relevant Orders   Strep Gp B NAA   Screening, antenatal, for Streptococcus B       Relevant Orders   Strep Gp B NAA     - GBS today - NST for AMA>40, BPP ordered for next week so unless concern on BPP won't need NST - IOL scheduled for 07/29/16

## 2016-07-11 NOTE — Progress Notes (Signed)
ROB GBS 

## 2016-07-13 LAB — STREP GP B NAA: Strep Gp B NAA: NEGATIVE

## 2016-07-19 ENCOUNTER — Inpatient Hospital Stay
Admission: EM | Admit: 2016-07-19 | Discharge: 2016-07-22 | DRG: 767 | Disposition: A | Discharge status: Home / Self Care | Payer: Medicaid Other | Source: Ambulatory Visit | Attending: Obstetrics & Gynecology | Admitting: Obstetrics & Gynecology

## 2016-07-19 DIAGNOSIS — D649 Anemia, unspecified: Secondary | ICD-10-CM | POA: Diagnosis present

## 2016-07-19 DIAGNOSIS — E0590 Thyrotoxicosis, unspecified without thyrotoxic crisis or storm: Secondary | ICD-10-CM | POA: Diagnosis present

## 2016-07-19 DIAGNOSIS — O99284 Endocrine, nutritional and metabolic diseases complicating childbirth: Principal | ICD-10-CM | POA: Diagnosis present

## 2016-07-19 DIAGNOSIS — O2243 Hemorrhoids in pregnancy, third trimester: Secondary | ICD-10-CM | POA: Diagnosis present

## 2016-07-19 DIAGNOSIS — E669 Obesity, unspecified: Secondary | ICD-10-CM | POA: Diagnosis present

## 2016-07-19 DIAGNOSIS — O9902 Anemia complicating childbirth: Secondary | ICD-10-CM | POA: Diagnosis present

## 2016-07-19 DIAGNOSIS — Z6834 Body mass index (BMI) 34.0-34.9, adult: Secondary | ICD-10-CM

## 2016-07-19 DIAGNOSIS — O09523 Supervision of elderly multigravida, third trimester: Secondary | ICD-10-CM | POA: Diagnosis present

## 2016-07-19 DIAGNOSIS — O9952 Diseases of the respiratory system complicating childbirth: Secondary | ICD-10-CM | POA: Diagnosis present

## 2016-07-19 DIAGNOSIS — J45909 Unspecified asthma, uncomplicated: Secondary | ICD-10-CM | POA: Diagnosis present

## 2016-07-19 DIAGNOSIS — Z87891 Personal history of nicotine dependence: Secondary | ICD-10-CM

## 2016-07-19 DIAGNOSIS — Z302 Encounter for sterilization: Secondary | ICD-10-CM

## 2016-07-19 DIAGNOSIS — Z3A38 38 weeks gestation of pregnancy: Secondary | ICD-10-CM

## 2016-07-19 DIAGNOSIS — O99824 Streptococcus B carrier state complicating childbirth: Secondary | ICD-10-CM | POA: Diagnosis present

## 2016-07-19 DIAGNOSIS — O99214 Obesity complicating childbirth: Secondary | ICD-10-CM | POA: Diagnosis present

## 2016-07-19 NOTE — OB Triage Note (Signed)
Patient came in for observation for labor evaluation. Patient reports contractions every five minutes that started this evening at 1400 that have got closer together and more intense. Patient rates pain 7 out of 10. Patient denies leaking of fluid, denies vaginal bleeding and spotting. Patient states she has been feeling the baby move fine. Vital signs stable and patient afebrile. FHR baseline 145 with moderate variability with accelerations 15 x 15 and no decelerations. Family at bedside. Will continue to monitor.

## 2016-07-20 ENCOUNTER — Inpatient Hospital Stay: Payer: Medicaid Other | Admitting: Certified Registered"

## 2016-07-20 DIAGNOSIS — Z302 Encounter for sterilization: Secondary | ICD-10-CM | POA: Diagnosis not present

## 2016-07-20 DIAGNOSIS — O99214 Obesity complicating childbirth: Secondary | ICD-10-CM | POA: Diagnosis present

## 2016-07-20 DIAGNOSIS — J45909 Unspecified asthma, uncomplicated: Secondary | ICD-10-CM | POA: Diagnosis present

## 2016-07-20 DIAGNOSIS — Z3493 Encounter for supervision of normal pregnancy, unspecified, third trimester: Secondary | ICD-10-CM | POA: Diagnosis present

## 2016-07-20 DIAGNOSIS — O99824 Streptococcus B carrier state complicating childbirth: Secondary | ICD-10-CM | POA: Diagnosis not present

## 2016-07-20 DIAGNOSIS — O9902 Anemia complicating childbirth: Secondary | ICD-10-CM | POA: Diagnosis present

## 2016-07-20 DIAGNOSIS — D649 Anemia, unspecified: Secondary | ICD-10-CM | POA: Diagnosis present

## 2016-07-20 DIAGNOSIS — E669 Obesity, unspecified: Secondary | ICD-10-CM | POA: Diagnosis present

## 2016-07-20 DIAGNOSIS — Z3A38 38 weeks gestation of pregnancy: Secondary | ICD-10-CM

## 2016-07-20 DIAGNOSIS — Z6834 Body mass index (BMI) 34.0-34.9, adult: Secondary | ICD-10-CM | POA: Diagnosis not present

## 2016-07-20 DIAGNOSIS — Z87891 Personal history of nicotine dependence: Secondary | ICD-10-CM | POA: Diagnosis not present

## 2016-07-20 DIAGNOSIS — O2243 Hemorrhoids in pregnancy, third trimester: Secondary | ICD-10-CM | POA: Diagnosis present

## 2016-07-20 DIAGNOSIS — O9952 Diseases of the respiratory system complicating childbirth: Secondary | ICD-10-CM | POA: Diagnosis present

## 2016-07-20 DIAGNOSIS — O99284 Endocrine, nutritional and metabolic diseases complicating childbirth: Secondary | ICD-10-CM | POA: Diagnosis present

## 2016-07-20 DIAGNOSIS — E0590 Thyrotoxicosis, unspecified without thyrotoxic crisis or storm: Secondary | ICD-10-CM | POA: Diagnosis not present

## 2016-07-20 LAB — CBC
HCT: 32.7 % — ABNORMAL LOW (ref 35.0–47.0)
HEMOGLOBIN: 11.3 g/dL — AB (ref 12.0–16.0)
MCH: 30 pg (ref 26.0–34.0)
MCHC: 34.7 g/dL (ref 32.0–36.0)
MCV: 86.4 fL (ref 80.0–100.0)
PLATELETS: 180 10*3/uL (ref 150–440)
RBC: 3.78 MIL/uL — AB (ref 3.80–5.20)
RDW: 14.5 % (ref 11.5–14.5)
WBC: 10.5 10*3/uL (ref 3.6–11.0)

## 2016-07-20 LAB — TYPE AND SCREEN
ABO/RH(D): O POS
Antibody Screen: NEGATIVE

## 2016-07-20 MED ORDER — ONDANSETRON HCL 4 MG PO TABS: 4.0000 mg | ORAL_TABLET | ORAL | Status: DC | PRN

## 2016-07-20 MED ORDER — ONDANSETRON HCL 4 MG/2ML IJ SOLN
4.0000 mg | Freq: Four times a day (QID) | INTRAMUSCULAR | Status: DC | PRN
  Administered 2016-07-20: 4 mg via INTRAVENOUS
  Filled 2016-07-20: qty 2

## 2016-07-20 MED ORDER — LIDOCAINE HCL (PF) 1 % IJ SOLN
INTRAMUSCULAR | Status: DC | PRN
  Administered 2016-07-20: 3 mL via INTRADERMAL

## 2016-07-20 MED ORDER — ZOLPIDEM TARTRATE 5 MG PO TABS: 5.0000 mg | ORAL_TABLET | Freq: Every evening | ORAL | Status: DC | PRN

## 2016-07-20 MED ORDER — FENTANYL 2.5 MCG/ML W/ROPIVACAINE 0.15% IN NS 100 ML EPIDURAL (ARMC)
EPIDURAL | Status: AC
  Filled 2016-07-20: qty 100

## 2016-07-20 MED ORDER — MONTELUKAST SODIUM 10 MG PO TABS
10.0000 mg | ORAL_TABLET | Freq: Every day | ORAL | Status: DC
  Filled 2016-07-20: qty 1

## 2016-07-20 MED ORDER — BUPIVACAINE HCL (PF) 0.25 % IJ SOLN
INTRAMUSCULAR | Status: DC | PRN
  Administered 2016-07-20: 3 mL via EPIDURAL
  Administered 2016-07-20: 5 mL via EPIDURAL

## 2016-07-20 MED ORDER — DIBUCAINE 1 % RE OINT
1.0000 "application " | TOPICAL_OINTMENT | RECTAL | Status: DC | PRN
  Administered 2016-07-22: 1 via RECTAL
  Filled 2016-07-20: qty 28

## 2016-07-20 MED ORDER — OXYTOCIN BOLUS FROM INFUSION
500.0000 mL | Freq: Once | INTRAVENOUS | Status: AC
  Administered 2016-07-20: 500 mL via INTRAVENOUS

## 2016-07-20 MED ORDER — LIDOCAINE-EPINEPHRINE (PF) 1.5 %-1:200000 IJ SOLN
INTRAMUSCULAR | Status: DC | PRN
  Administered 2016-07-20: 3 mL via EPIDURAL

## 2016-07-20 MED ORDER — SODIUM CHLORIDE 0.9 % IV SOLN
1.0000 g | INTRAVENOUS | Status: DC
  Administered 2016-07-20: 1 g via INTRAVENOUS
  Filled 2016-07-20 (×6): qty 1000

## 2016-07-20 MED ORDER — BUTORPHANOL TARTRATE 1 MG/ML IJ SOLN
1.0000 mg | INTRAMUSCULAR | Status: DC | PRN
  Administered 2016-07-20: 1 mg via INTRAVENOUS
  Filled 2016-07-20: qty 1

## 2016-07-20 MED ORDER — OXYTOCIN 40 UNITS IN LACTATED RINGERS INFUSION - SIMPLE MED: 2.5000 [IU]/h | INTRAVENOUS | Status: DC

## 2016-07-20 MED ORDER — ALBUTEROL SULFATE (2.5 MG/3ML) 0.083% IN NEBU: 3.0000 mL | INHALATION_SOLUTION | RESPIRATORY_TRACT | Status: DC | PRN

## 2016-07-20 MED ORDER — LIDOCAINE HCL (PF) 1 % IJ SOLN
INTRAMUSCULAR | Status: AC
  Filled 2016-07-20: qty 30

## 2016-07-20 MED ORDER — COCONUT OIL OIL
1.0000 "application " | TOPICAL_OIL | Status: DC | PRN
  Filled 2016-07-20: qty 120

## 2016-07-20 MED ORDER — BENZOCAINE-MENTHOL 20-0.5 % EX AERO
1.0000 "application " | INHALATION_SPRAY | CUTANEOUS | Status: DC | PRN
  Administered 2016-07-22: 1 via TOPICAL
  Filled 2016-07-20: qty 56

## 2016-07-20 MED ORDER — SENNOSIDES-DOCUSATE SODIUM 8.6-50 MG PO TABS
2.0000 | ORAL_TABLET | ORAL | Status: DC
  Administered 2016-07-21 – 2016-07-22 (×2): 2 via ORAL
  Filled 2016-07-20 (×2): qty 2

## 2016-07-20 MED ORDER — LACTATED RINGERS IV SOLN
500.0000 mL | INTRAVENOUS | Status: DC | PRN
  Administered 2016-07-20: 1000 mL via INTRAVENOUS

## 2016-07-20 MED ORDER — OXYCODONE-ACETAMINOPHEN 5-325 MG PO TABS
1.0000 | ORAL_TABLET | ORAL | Status: DC | PRN
  Administered 2016-07-21 – 2016-07-22 (×2): 1 via ORAL
  Filled 2016-07-20 (×2): qty 1

## 2016-07-20 MED ORDER — OXYCODONE-ACETAMINOPHEN 5-325 MG PO TABS
2.0000 | ORAL_TABLET | ORAL | Status: DC | PRN
  Administered 2016-07-20 – 2016-07-22 (×5): 2 via ORAL
  Filled 2016-07-20 (×4): qty 2

## 2016-07-20 MED ORDER — SODIUM CHLORIDE 0.9 % IV SOLN: 250.0000 mL | INTRAVENOUS | Status: DC | PRN

## 2016-07-20 MED ORDER — SIMETHICONE 80 MG PO CHEW: 80.0000 mg | CHEWABLE_TABLET | ORAL | Status: DC | PRN

## 2016-07-20 MED ORDER — MORPHINE SULFATE (PF) 10 MG/ML IV SOLN
10.0000 mg | Freq: Once | INTRAVENOUS | Status: AC
  Administered 2016-07-20: 10 mg via INTRAMUSCULAR
  Filled 2016-07-20: qty 1

## 2016-07-20 MED ORDER — AMMONIA AROMATIC IN INHA
RESPIRATORY_TRACT | Status: AC
  Filled 2016-07-20: qty 10

## 2016-07-20 MED ORDER — ONDANSETRON HCL 4 MG/2ML IJ SOLN
4.0000 mg | INTRAMUSCULAR | Status: DC | PRN
  Administered 2016-07-21: 4 mg via INTRAVENOUS

## 2016-07-20 MED ORDER — ACETAMINOPHEN 325 MG PO TABS
650.0000 mg | ORAL_TABLET | ORAL | Status: DC | PRN
Start: 2016-07-20 — End: 2016-07-22

## 2016-07-20 MED ORDER — SODIUM CHLORIDE 0.9% FLUSH: 3.0000 mL | INTRAVENOUS | Status: DC | PRN

## 2016-07-20 MED ORDER — DIPHENHYDRAMINE HCL 25 MG PO CAPS: 25.0000 mg | ORAL_CAPSULE | Freq: Four times a day (QID) | ORAL | Status: DC | PRN

## 2016-07-20 MED ORDER — SODIUM CHLORIDE 0.9 % IV SOLN
2.0000 g | Freq: Once | INTRAVENOUS | Status: AC
  Administered 2016-07-20: 2 g via INTRAVENOUS
  Filled 2016-07-20: qty 2000

## 2016-07-20 MED ORDER — ACETAMINOPHEN 325 MG PO TABS: 650.0000 mg | ORAL_TABLET | ORAL | Status: DC | PRN

## 2016-07-20 MED ORDER — LACTATED RINGERS IV SOLN
INTRAVENOUS | Status: DC
  Administered 2016-07-20 (×2): via INTRAVENOUS
  Administered 2016-07-20: 1000 mL via INTRAVENOUS

## 2016-07-20 MED ORDER — TERBUTALINE SULFATE 1 MG/ML IJ SOLN: 0.2500 mg | Freq: Once | INTRAMUSCULAR | Status: DC | PRN

## 2016-07-20 MED ORDER — OXYTOCIN 10 UNIT/ML IJ SOLN
INTRAMUSCULAR | Status: AC
  Filled 2016-07-20: qty 2

## 2016-07-20 MED ORDER — SODIUM CHLORIDE 0.9% FLUSH: 3.0000 mL | Freq: Two times a day (BID) | INTRAVENOUS | Status: DC

## 2016-07-20 MED ORDER — OXYTOCIN 40 UNITS IN LACTATED RINGERS INFUSION - SIMPLE MED
1.0000 m[IU]/min | INTRAVENOUS | Status: DC
  Administered 2016-07-20: 2 m[IU]/min via INTRAVENOUS
  Filled 2016-07-20: qty 1000

## 2016-07-20 MED ORDER — FENTANYL 2.5 MCG/ML W/ROPIVACAINE 0.15% IN NS 100 ML EPIDURAL (ARMC)
EPIDURAL | Status: DC | PRN
  Administered 2016-07-20: 12 mL/h via EPIDURAL

## 2016-07-20 MED ORDER — MISOPROSTOL 200 MCG PO TABS
ORAL_TABLET | ORAL | Status: AC
  Filled 2016-07-20: qty 4

## 2016-07-20 MED ORDER — WITCH HAZEL-GLYCERIN EX PADS
1.0000 "application " | MEDICATED_PAD | CUTANEOUS | Status: DC | PRN
  Administered 2016-07-21: 1 via TOPICAL
  Filled 2016-07-20: qty 100

## 2016-07-20 NOTE — Progress Notes (Signed)
  Labor Progress Note   41 y.o. [id number] @ 5375w1d , admitted for  Pregnancy, Labor Management.   Subjective:  Pain w ctxs moderate  Objective:  BP 105/67 (BP Location: Left Arm)   Pulse 76   Temp 98 F (36.7 C) (Oral)   Resp 14   Ht 5\' 4"  (1.626 m)   Wt 201 lb (91.2 kg)   LMP 10/08/2015   BMI 34.50 kg/m  Abd: moderate Extr: trace to 1+ bilateral pedal edema SVE: CERVIX: 4 cm dilated, 75 effaced, -2 station  EFM: FHR: 140 bpm, variability: moderate,  accelerations:  Present,  decelerations:  Absent Toco: Frequency: Every 3-5 minutes on Pitocin 12 mU/min AROM- Clear Labs: I have reviewed the patient's lab results.   Assessment & Plan:  [id number] @ 2675w1d, admitted for  Pregnancy and Labor/Delivery Management  1. Pain management: none. 2. FWB: FHT category 1.  3. ID: GBS positive 4. Labor management: Cont Ampicillin. Cont Pitocin. AROM clear.  Epidural when ready.    All discussed with patient, see orders  Annamarie MajorPaul Anjelica Gorniak, MD, Merlinda FrederickFACOG [facility], [facility] 07/20/2016  12:58 PM

## 2016-07-20 NOTE — Discharge Summary (Signed)
I have personally reviewed the patient's history, indication for care, and the plan as formulated by the midwife.  I agree with the below elements or have edited them to reflect my assessment and plan.  Thomasene MohairStephen Jackson, MD 07/22/2016 10:14 PM     OB Discharge Summary     Patient Name: Latasha LickJael Karina Redinger DOB: [birth year] MRN: [id number]  Date of admission: 07/19/2016 Delivering MD: Annamarie MajorPaul Harris, MD  Date of Delivery: 07/20/2016 Date of discharge: 07/22/2016 Admitting diagnosis: Onset of labor Intrauterine pregnancy: 6120w1d     Secondary diagnosis: AMA, history of rapid labors     Discharge diagnosis: Term Pregnancy Delivered                                                                                                Post partum procedures:postpartum tubal ligation  Augmentation: AROM and Pitocin  Complications: None  Hospital course:  Onset of Labor With Vaginal Delivery     41 y.o. yo [id number] at 1620w1d was admitted in Active Labor on 07/19/2016. Patient had an uncomplicated labor course as follows:  Membrane Rupture Time/Date: 12:19 PM ,07/20/2016   Intrapartum Procedures: Episiotomy: None [1]                                         Lacerations:  1st degree [2]  Patient had a delivery of a Viable infant. 07/20/2016  Information for the patient's newborn:  Clide Daleslvarez, Boy Khushbu [[id number]][[id number]]  Delivery Method: Vag-Spont  Delivery Note Primary OB: [facility] Delivery Physician: Annamarie MajorPaul Harris, MD Gestational Age: Full term Antepartum complications: none Intrapartum complications: None  A viable Female was delivered via vertex perentation.  Apgars:8 ,9  Weight: 7#15.7oz/ Emanuel  Placenta status: spontaneous and Intact.  Cord: 3+ vessels;  with the following complications: nuchal.  Anesthesia:  epidural Episiotomy:  none Lacerations:  1st Suture Repair: 2.0 vicryl Est. Blood Loss (mL):  200 mL  Mom to postpartum.  Baby to Couplet care / Skin to Skin.  Pateint had an uncomplicated  postpartum course.  She is ambulating, tolerating a regular diet, passing flatus, and urinating well. Patient is discharged home in stable condition on 07/22/2016   Physical exam  Vitals:   07/22/16 0006 07/22/16 0513 07/22/16 0736 07/22/16 0851  BP: (!) 95/49 (!) 83/53 (!) 153/85 97/64  Pulse: 70 65 76 75  Resp: 20 20  18   Temp: 98.5 F (36.9 C) 98.6 F (37 C) 98.5 F (36.9 C) 98.4 F (36.9 C)  TempSrc: Oral Oral Oral Oral  SpO2:    100%  Weight:      Height:       General: alert, cooperative and no distress. Complains of lower abdominal cramping, right shoulder pain and hemorrhoidal pain Lochia: appropriate Abdomen: incision from tubal C+D+I Uterine Fundus: firm/ U-2/ML/tender due to incison Perineum: intact. Swollen, non thrombosed external hemorrhoid cluster.  DVT Evaluation: No evidence of DVT seen on physical exam.  Labs: Lab Results  Component Value Date   WBC 10.9 07/21/2016   HGB  10.9 (L) 07/21/2016   HCT 32.0 (L) 07/21/2016   MCV 88.3 07/21/2016   PLT 174 07/21/2016   CMP Latest Ref Rng & Units 01/07/2016  Glucose 65 - 99 mg/dL 95  BUN 6 - 20 mg/dL 7  Creatinine 1.61 - 0.96 mg/dL 0.45  Sodium 409 - 811 mmol/L 134(L)  Potassium 3.5 - 5.1 mmol/L 3.3(L)  Chloride 101 - 111 mmol/L 105  CO2 22 - 32 mmol/L 21(L)  Calcium 8.9 - 10.3 mg/dL 9.1(Y)  Total Protein 6.5 - 8.1 g/dL 7.8  Total Bilirubin 0.3 - 1.2 mg/dL 0.7  Alkaline Phos 38 - 126 U/L 38  AST 15 - 41 U/L 22  ALT 14 - 54 U/L 20    Discharge instruction: per After Visit Summary.  Medications:  Allergies as of 07/22/2016      Reactions   Flonase  [fluticasone Propionate]    Other reaction(s): Headache      Medication List    TAKE these medications   albuterol 108 (90 Base) MCG/ACT inhaler Commonly known as:  PROVENTIL HFA;VENTOLIN HFA Inhale 1-2 puffs into the lungs every 4 (four) hours as needed for wheezing or shortness of breath.   docusate sodium 100 MG capsule Commonly known as:   COLACE Take 1 capsule (100 mg total) by mouth 2 (two) times daily as needed.   hydrocortisone-pramoxine 2.5-1 % rectal cream Commonly known as:  ANALPRAM HC Place 1 application rectally 4 (four) times daily. For hemorrhoidal pain   ibuprofen 600 MG tablet Commonly known as:  ADVIL,MOTRIN Take 1 tablet (600 mg total) by mouth every 6 (six) hours as needed for mild pain, moderate pain or cramping.   montelukast 10 MG tablet Commonly known as:  SINGULAIR Take 1 tablet (10 mg total) by mouth at bedtime.   MULTI-VITAMINS Tabs Take by mouth.   oxyCODONE-acetaminophen 5-325 MG tablet Commonly known as:  PERCOCET/ROXICET Take 1-2 tablets by mouth every 6 (six) hours as needed for moderate pain or severe pain.       Diet: routine diet  Activity: Advance as tolerated. Pelvic rest for 6 weeks.   Outpatient follow up: Follow-up Information    Nadara Mustard, MD Follow up in 2 week(s).   Specialty:  Obstetrics and Gynecology Contact information: [address] [place] [address] [phone]             Postpartum contraception: Tubal Ligation Rhogam Given postpartum: no Rubella vaccine given postpartum: no Varicella vaccine given postpartum: no TDaP given antepartum or postpartum: AP  Newborn Data: Live born female  Birth Weight:  7#15.7oz APGAR: 8, 9   Baby Feeding: Bottle and Breast  Disposition:home with mother  SIGNED: Farrel Conners, CNM Farrel Conners, CNM 07/22/2016 11:48 AM

## 2016-07-20 NOTE — Anesthesia Procedure Notes (Signed)
Epidural Patient location during procedure: OB  Staffing Anesthesiologist: Priscella MannPENWARDEN, AMY Resident/CRNA: Mathews ArgyleLOGAN, Mantaj Chamberlin Performed: resident/CRNA   Preanesthetic Checklist Completed: patient identified, site marked, surgical consent, pre-op evaluation, timeout performed, IV checked, risks and benefits discussed and monitors and equipment checked  Epidural Patient position: sitting Prep: ChloraPrep and site prepped and draped Patient monitoring: heart rate, continuous pulse ox and blood pressure Approach: midline Location: L4-L5 Injection technique: LOR saline  Needle:  Needle type: Tuohy  Needle gauge: 17 G Needle length: 9 cm and 9 Needle insertion depth: 7 cm Catheter type: closed end flexible Catheter size: 19 Gauge Catheter at skin depth: 12 cm Test dose: negative and 1.5% lidocaine with Epi 1:200 K  Assessment Events: blood not aspirated, injection not painful, no injection resistance, negative IV test and no paresthesia  Additional Notes   Patient tolerated the insertion well without complications.Reason for block:procedure for pain
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
## 2016-07-20 NOTE — Anesthesia Preprocedure Evaluation (Signed)
Anesthesia Evaluation  Patient identified by MRN, date of birth, ID band Patient awake    Reviewed: Allergy & Precautions, H&P , NPO status , Patient's Chart, lab work & pertinent test results  Airway Mallampati: III  TM Distance: >3 FB Neck ROM: full    Dental  (+) Poor Dentition   Pulmonary former smoker,    Pulmonary exam normal        Cardiovascular negative cardio ROS Normal cardiovascular exam     Neuro/Psych  Neuromuscular disease (chronic lower back pain)    GI/Hepatic GERD  ,  Endo/Other    Renal/GU negative Renal ROS     Musculoskeletal   Abdominal   Peds  Hematology negative hematology ROS (+)   Anesthesia Other Findings   Reproductive/Obstetrics (+) Pregnancy                             Anesthesia Physical Anesthesia Plan  ASA: III  Anesthesia Plan: Epidural   Post-op Pain Management:    Induction:   PONV Risk Score and Plan:   Airway Management Planned:   Additional Equipment:   Intra-op Plan:   Post-operative Plan:   Informed Consent: I have reviewed the patients History and Physical, chart, labs and discussed the procedure including the risks, benefits and alternatives for the proposed anesthesia with the patient or authorized representative who has indicated his/her understanding and acceptance.     Plan Discussed with: CRNA and Anesthesiologist  Anesthesia Plan Comments:         Anesthesia Quick Evaluation

## 2016-07-21 ENCOUNTER — Inpatient Hospital Stay: Payer: Medicaid Other | Admitting: Anesthesiology

## 2016-07-21 ENCOUNTER — Encounter
Admission: EM | Disposition: A | Discharge status: Home / Self Care | Payer: Self-pay | Source: Ambulatory Visit | Attending: Obstetrics & Gynecology

## 2016-07-21 ENCOUNTER — Telehealth: Payer: Self-pay | Admitting: Obstetrics & Gynecology

## 2016-07-21 ENCOUNTER — Encounter: Payer: Self-pay | Admitting: Anesthesiology

## 2016-07-21 ENCOUNTER — Other Ambulatory Visit: Payer: Medicaid Other

## 2016-07-21 ENCOUNTER — Encounter: Payer: Medicaid Other | Admitting: Advanced Practice Midwife

## 2016-07-21 DIAGNOSIS — Z302 Encounter for sterilization: Secondary | ICD-10-CM

## 2016-07-21 HISTORY — PX: TUBAL LIGATION: SHX77

## 2016-07-21 LAB — RPR: RPR: NONREACTIVE

## 2016-07-21 LAB — CBC
HCT: 32 % — ABNORMAL LOW (ref 35.0–47.0)
Hemoglobin: 10.9 g/dL — ABNORMAL LOW (ref 12.0–16.0)
MCH: 30.3 pg (ref 26.0–34.0)
MCHC: 34.3 g/dL (ref 32.0–36.0)
MCV: 88.3 fL (ref 80.0–100.0)
PLATELETS: 174 10*3/uL (ref 150–440)
RBC: 3.62 MIL/uL — AB (ref 3.80–5.20)
RDW: 14.3 % (ref 11.5–14.5)
WBC: 10.9 10*3/uL (ref 3.6–11.0)

## 2016-07-21 SURGERY — LIGATION, FALLOPIAN TUBE, POSTPARTUM
Anesthesia: Spinal | Laterality: Bilateral | Wound class: Clean Contaminated

## 2016-07-21 MED ORDER — SUCCINYLCHOLINE CHLORIDE 20 MG/ML IJ SOLN
INTRAMUSCULAR | Status: AC
  Filled 2016-07-21: qty 1

## 2016-07-21 MED ORDER — MIDAZOLAM HCL 2 MG/2ML IJ SOLN
INTRAMUSCULAR | Status: AC
  Filled 2016-07-21: qty 2

## 2016-07-21 MED ORDER — PROPOFOL 10 MG/ML IV BOLUS
INTRAVENOUS | Status: AC
  Filled 2016-07-21: qty 20

## 2016-07-21 MED ORDER — PROPOFOL 10 MG/ML IV BOLUS
INTRAVENOUS | Status: DC | PRN
  Administered 2016-07-21: 150 mg via INTRAVENOUS

## 2016-07-21 MED ORDER — MIDAZOLAM HCL 5 MG/5ML IJ SOLN
INTRAMUSCULAR | Status: DC | PRN
  Administered 2016-07-21: 1 mg via INTRAVENOUS

## 2016-07-21 MED ORDER — BUPIVACAINE IN DEXTROSE 0.75-8.25 % IT SOLN
INTRATHECAL | Status: DC | PRN
  Administered 2016-07-21: 2 mL via INTRATHECAL

## 2016-07-21 MED ORDER — SUCCINYLCHOLINE CHLORIDE 20 MG/ML IJ SOLN
INTRAMUSCULAR | Status: DC | PRN
  Administered 2016-07-21: 100 mg via INTRAVENOUS

## 2016-07-21 MED ORDER — IBUPROFEN 400 MG PO TABS
400.0000 mg | ORAL_TABLET | Freq: Four times a day (QID) | ORAL | Status: DC | PRN
  Administered 2016-07-21 – 2016-07-22 (×4): 400 mg via ORAL
  Filled 2016-07-21 (×4): qty 1

## 2016-07-21 MED ORDER — LACTATED RINGERS IV SOLN
INTRAVENOUS | Status: DC | PRN
  Administered 2016-07-21: 11:00:00 via INTRAVENOUS

## 2016-07-21 MED ORDER — FENTANYL CITRATE (PF) 100 MCG/2ML IJ SOLN
INTRAMUSCULAR | Status: DC | PRN
  Administered 2016-07-21 (×2): 50 ug via INTRAVENOUS

## 2016-07-21 MED ORDER — BUPIVACAINE HCL 0.5 % IJ SOLN
INTRAMUSCULAR | Status: DC | PRN
  Administered 2016-07-21: 7 mL

## 2016-07-21 MED ORDER — FENTANYL CITRATE (PF) 100 MCG/2ML IJ SOLN
25.0000 ug | INTRAMUSCULAR | Status: DC | PRN
  Administered 2016-07-21 (×2): 50 ug via INTRAVENOUS

## 2016-07-21 MED ORDER — DEXAMETHASONE SODIUM PHOSPHATE 10 MG/ML IJ SOLN
INTRAMUSCULAR | Status: DC | PRN
  Administered 2016-07-21: 10 mg via INTRAVENOUS

## 2016-07-21 MED ORDER — FENTANYL CITRATE (PF) 100 MCG/2ML IJ SOLN
INTRAMUSCULAR | Status: AC
  Filled 2016-07-21: qty 2

## 2016-07-21 MED ORDER — LIDOCAINE HCL (PF) 2 % IJ SOLN
INTRAMUSCULAR | Status: AC
  Filled 2016-07-21: qty 2

## 2016-07-21 MED ORDER — DEXAMETHASONE SODIUM PHOSPHATE 10 MG/ML IJ SOLN
INTRAMUSCULAR | Status: AC
  Filled 2016-07-21: qty 1

## 2016-07-21 MED ORDER — ONDANSETRON HCL 4 MG/2ML IJ SOLN: 4.0000 mg | Freq: Once | INTRAMUSCULAR | Status: DC | PRN

## 2016-07-21 MED ORDER — PHENYLEPHRINE HCL 10 MG/ML IJ SOLN
INTRAMUSCULAR | Status: DC | PRN
  Administered 2016-07-21 (×2): 100 ug via INTRAVENOUS

## 2016-07-21 MED ORDER — ONDANSETRON HCL 4 MG/2ML IJ SOLN
INTRAMUSCULAR | Status: AC
  Filled 2016-07-21: qty 2

## 2016-07-21 SURGICAL SUPPLY — 24 items
CHLORAPREP W/TINT 26ML (MISCELLANEOUS) ×3 IMPLANT
DERMABOND ADVANCED (GAUZE/BANDAGES/DRESSINGS) ×2
DERMABOND ADVANCED .7 DNX12 (GAUZE/BANDAGES/DRESSINGS) ×1 IMPLANT
DRAPE LAPAROTOMY T 102X78X121 (DRAPES) ×3 IMPLANT
DRSG TEGADERM 2-3/8X2-3/4 SM (GAUZE/BANDAGES/DRESSINGS) IMPLANT
DRSG TELFA 4X3 1S NADH ST (GAUZE/BANDAGES/DRESSINGS) IMPLANT
ELECT CAUTERY BLADE 6.4 (BLADE) ×3 IMPLANT
ELECT REM PT RETURN 9FT ADLT (ELECTROSURGICAL) ×3
ELECTRODE REM PT RTRN 9FT ADLT (ELECTROSURGICAL) ×1 IMPLANT
GLOVE BIO SURGEON STRL SZ8 (GLOVE) ×3 IMPLANT
GOWN STRL REUS W/ TWL LRG LVL3 (GOWN DISPOSABLE) ×1 IMPLANT
GOWN STRL REUS W/ TWL XL LVL3 (GOWN DISPOSABLE) ×1 IMPLANT
GOWN STRL REUS W/TWL LRG LVL3 (GOWN DISPOSABLE) ×2
GOWN STRL REUS W/TWL XL LVL3 (GOWN DISPOSABLE) ×2
LABEL OR SOLS (LABEL) ×3 IMPLANT
NDL SAFETY 22GX1.5 (NEEDLE) ×3 IMPLANT
NS IRRIG 500ML POUR BTL (IV SOLUTION) ×3 IMPLANT
PACK BASIN MINOR ARMC (MISCELLANEOUS) ×3 IMPLANT
STRAP SAFETY BODY (MISCELLANEOUS) ×3 IMPLANT
SUT VIC AB 0 CT2 27 (SUTURE) ×3 IMPLANT
SUT VIC AB 0 SH 27 (SUTURE) ×3 IMPLANT
SUT VIC AB 2-0 UR6 27 (SUTURE) ×6 IMPLANT
SUT VIC AB 4-0 PS2 18 (SUTURE) IMPLANT
SYRINGE 10CC LL (SYRINGE) ×3 IMPLANT

## 2016-07-21 NOTE — Transfer of Care (Signed)
Immediate Anesthesia Transfer of Care Note  Patient: Latasha Waters ClayKarina Shill  Procedure(s) Performed: Procedure(s): POST PARTUM TUBAL LIGATION (Bilateral)  Patient Location: PACU  Anesthesia Type:Spinal  Level of Consciousness: awake, alert  and oriented  Airway & Oxygen Therapy: Patient Spontanous Breathing  Post-op Assessment: Report given to RN and Post -op Vital signs reviewed and stable  Post vital signs: Reviewed and stable  Last Vitals:  Vitals:   07/21/16 1032 07/21/16 1207  BP: 101/65 (!) 93/59  Pulse: 83 81  Resp: 18 11  Temp: 36.8 C 36.4 C    Last Pain:  Vitals:   07/21/16 1207  TempSrc:   PainSc: Asleep      Patients Stated Pain Goal: 0 (07/20/16 1417)  Complications: No apparent anesthesia complications

## 2016-07-21 NOTE — Progress Notes (Signed)
Ice chips given to patient.  Patient wanting to return To her room to see the baby.  Patient unable to feel Anything below her belly button.

## 2016-07-21 NOTE — Anesthesia Postprocedure Evaluation (Signed)
Anesthesia Post Note  Patient: Latasha Waters  Procedure(s) Performed: * No procedures listed *  Patient location during evaluation: Mother Baby Anesthesia Type: Epidural Level of consciousness: awake and alert Pain management: pain level controlled Vital Signs Assessment: post-procedure vital signs reviewed and stable Respiratory status: spontaneous breathing, nonlabored ventilation and respiratory function stable Cardiovascular status: stable Postop Assessment: no headache, no backache and epidural receding Anesthetic complications: no     Last Vitals:  Vitals:   07/20/16 2346 07/21/16 0321  BP: (!) 90/59 109/67  Pulse: 83 79  Resp: 20 20  Temp: 36.9 C 36.5 C    Last Pain:  Vitals:   07/21/16 0338  TempSrc:   PainSc: 8                  Latasha Waters,  Latasha Waters

## 2016-07-21 NOTE — Op Note (Signed)
Operative Note  07/21/2016  PRE-OP DIAGNOSIS: Desire for sterility  POST-OP DIAGNOSIS: same  SURGEON: Annamarie MajorPaul Jaziyah Gradel, MD, FACOG  PROCEDURE: Postpartum Bilateral Tubal Ligation Procedure   ANESTHESIA: Choice   ESTIMATED BLOOD LOSS: Min, <20 mL  SPECIMENS: Portion of left and right tube  COMPLICATIONS: None  DISPOSITION: PACU - hemodynamically stable.  CONDITION: stable  FINDINGS: Exam under anesthesia revealed normal uterus with no masses and bilateral adnexa without masses or fullness.   TECHNIQUE:  Patient is prepped and draped in usual sterile fashion after adequate anesthesia is obtained in the supine position on the operating room table.  Local anesthesia is injected into the skin just inferior to the umbilicus, followed by a small elliptical incision with a scapel.  Fascia is identified and tented upwards, and an incision is made with Mayo scissors.  Identification of no adherent bowel is made. Retractors are placed and trendelenburg positioning is achieved.    The left Fallopian tube was identified, grasped with the Babcock clamps, lifted to the skin incision and followed out distally to the fimbriae. An avascular midsection of the tube approximately 3-4cm from the cornua was grasped with the babcock clamps and brought into a knuckle at the skin incision. The tube was double ligated with 2-0 Vicryl suture and the intervening portion of tube was transected and removed. Excellent hemostasis was noted and the tube was returned to the abdomen. Attention was then turned to the right fallopian tube after confirmation of identification by tracing the tube out to the fimbriae. The same procedure was then performed on the right Fallopian tube. Again, excellent hemostasis was noted at the end of the procedure.  Retractors are removed and fascia closed with a 2-0 Vicryl suture. Irrigation and hemostasis confirmed.  Skin closed with a 4-0 vicryl suture in a subcuticular fashion followed by skin  adhesive.  Pt goes to recovery room in stable fashion.  All counts correct times 2.   Annamarie MajorPaul Kimberley Speece, MD, [facility]linda FrederickFACOG [facility], [facility] 07/21/2016  12:00 PM

## 2016-07-21 NOTE — Anesthesia Procedure Notes (Signed)
Procedure Name: Intubation Date/Time: 07/21/2016 11:33 AM Performed by: Allean Found Pre-anesthesia Checklist: Patient identified, Emergency Drugs available, Suction available, Patient being monitored and Timeout performed Patient Re-evaluated:Patient Re-evaluated prior to induction Oxygen Delivery Method: Circle system utilized Preoxygenation: Pre-oxygenation with 100% oxygen Induction Type: IV induction Ventilation: Mask ventilation without difficulty Laryngoscope Size: Mac and 3 Grade View: Grade I Tube type: Oral Tube size: 7.0 mm Number of attempts: 1 Airway Equipment and Method: Stylet Placement Confirmation: ETT inserted through vocal cords under direct vision,  positive ETCO2 and breath sounds checked- equal and bilateral Secured at: 23 cm Tube secured with: Tape Dental Injury: Teeth and Oropharynx as per pre-operative assessment

## 2016-07-21 NOTE — Telephone Encounter (Signed)
I Called and left voicemail for pt to call back to be [facility]
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
## 2016-07-21 NOTE — Anesthesia Post-op Follow-up Note (Cosign Needed)
Anesthesia QCDR form completed.        

## 2016-07-21 NOTE — Progress Notes (Signed)
Dr. Karlton LemonKarenz in and aware of level of spinal.  Patient Unable to feel below her belly button.  Patient still Unable to move her hips or her legs.

## 2016-07-21 NOTE — Anesthesia Procedure Notes (Signed)
Spinal  Patient location during procedure: OR Start time: 07/21/2016 11:18 AM End time: 07/21/2016 11:22 AM Staffing Anesthesiologist: Martha Clan Resident/CRNA: Zuha Dejonge Performed: resident/CRNA  Preanesthetic Checklist Completed: patient identified, site marked, surgical consent, pre-op evaluation, timeout performed, IV checked, risks and benefits discussed and monitors and equipment checked Spinal Block Patient position: sitting Prep: ChloraPrep Patient monitoring: heart rate, continuous pulse ox, blood pressure and cardiac monitor Approach: midline Location: L3-4 Injection technique: single-shot Needle Needle type: Whitacre and Introducer  Needle gauge: 24 G Needle length: 9 cm Assessment Sensory level: T10 Additional Notes Negative paresthesia. Negative blood return. Positive free-flowing CSF. Expiration date of kit checked and confirmed. Patient tolerated procedure well, without complications.

## 2016-07-21 NOTE — Progress Notes (Signed)
Admit Date: 07/19/2016 Today's Date: 07/21/2016  Post Partum Day 1  Subjective:  no complaints  Objective: Temp:  [97.6 F (36.4 C)-98.5 F (36.9 C)] 97.7 F (36.5 C) (07/12 0321) Pulse Rate:  [76-113] 79 (07/12 0321) Resp:  [14-20] 20 (07/12 0321) BP: (90-119)/(44-88) 109/67 (07/12 0321) SpO2:  [94 %-100 %] 100 % (07/11 1813) Weight:  [201 lb (91.2 kg)] 201 lb (91.2 kg) (07/11 0850)  Physical Exam:  General: alert, cooperative and no distress Lochia: appropriate Uterine Fundus: firm Incision: none DVT Evaluation: No evidence of DVT seen on physical exam.   Recent Labs  07/20/16 0821 07/21/16 0538  HGB 11.3* 10.9*  HCT 32.7* 32.0*    Assessment/Plan: Infant doing well  Plan PP BTL today The patient has been fully informed about all methods of contraception, both temporary and permanent. She understands that tubal ligation is meant to be permanent, absolute and irreversible. She was told that there is an approximately 1 in 400 chance of a pregnancy in the future after tubal ligation. She was told the short and long term complications of tubal ligation. She understands the risks from this surgery include, but are not limited to, the risks of anesthesia, hemorrhage, infection, perforation, and injury to adjacent structures, bowel, bladder and blood vessels.  Min anemia, cont PNV TDaP UTD   LOS: 1 day   Letitia Libraobert Paul Camary [facility] 07/21/2016, 7:41 AM

## 2016-07-21 NOTE — Progress Notes (Signed)
Patient has some sensation to her upper Thighs but is unable to move her legs.  Can slightly Shake her hips side to side.

## 2016-07-21 NOTE — Telephone Encounter (Signed)
-----   Message from Nadara Mustardobert P Harris, MD sent at 07/21/2016  7:38 AM EDT ----- Regarding: today Schedule 6 week PP Visit for PH plz (for Latasha Waters)  Also, no further patients to my schedule today (cap it at 8 as is)  Thx!  Dr Tiburcio PeaHarris

## 2016-07-22 ENCOUNTER — Encounter: Payer: Self-pay | Admitting: Obstetrics & Gynecology

## 2016-07-22 ENCOUNTER — Telehealth: Payer: Self-pay | Admitting: Obstetrics and Gynecology

## 2016-07-22 LAB — SURGICAL PATHOLOGY

## 2016-07-22 MED ORDER — DOCUSATE SODIUM 100 MG PO CAPS: 100.0000 mg | ORAL_CAPSULE | Freq: Two times a day (BID) | ORAL | 2 refills | Status: DC | PRN

## 2016-07-22 MED ORDER — IBUPROFEN 600 MG PO TABS: 600.0000 mg | ORAL_TABLET | Freq: Four times a day (QID) | ORAL | 2 refills | Status: DC | PRN

## 2016-07-22 MED ORDER — OXYCODONE-ACETAMINOPHEN 5-325 MG PO TABS: 1.0000 | ORAL_TABLET | Freq: Four times a day (QID) | ORAL | 0 refills | Status: DC | PRN

## 2016-07-22 MED ORDER — HYDROCORTISONE ACE-PRAMOXINE 2.5-1 % RE CREA: 1.0000 "application " | TOPICAL_CREAM | Freq: Four times a day (QID) | RECTAL | 1 refills | Status: DC

## 2016-07-22 NOTE — Progress Notes (Signed)
Patient discharged home with infant. Discharge instructions, prescriptions and follow up appointment given to and reviewed with patient. Patient verbalized understanding. Escorted out via wheelchair by nursing staff.

## 2016-07-22 NOTE — Discharge Instructions (Signed)
Vaginal Delivery, Care After Refer to this sheet in the next few weeks. These instructions provide you with information about caring for yourself after vaginal delivery. Your health care provider may also give you more specific instructions. Your treatment has been planned according to current medical practices, but problems sometimes occur. Call your health care provider if you have any problems or questions. What can I expect after the procedure? After vaginal delivery, it is common to have:  Some bleeding from your vagina.  Soreness in your abdomen, your vagina, and the area of skin between your vaginal opening and your anus (perineum).  Pelvic cramps.  Fatigue.  Follow these instructions at home: Medicines  Take over-the-counter and prescription medicines only as told by your health care provider.  If you were prescribed an antibiotic medicine, take it as told by your health care provider. Do not stop taking the antibiotic until it is finished. Driving   Do not drive or operate heavy machinery while taking prescription pain medicine.  Do not drive for 24 hours if you received a sedative. Lifestyle  Do not drink alcohol. This is especially important if you are breastfeeding or taking medicine to relieve pain.  Do not use tobacco products, including cigarettes, chewing tobacco, or e-cigarettes. If you need help quitting, ask your health care provider. Eating and drinking  Drink at least 8 eight-ounce glasses of water every day unless you are told not to by your health care provider. If you choose to breastfeed your baby, you may need to drink more water than this.  Eat high-fiber foods every day. These foods may help prevent or relieve constipation. High-fiber foods include: ? Whole grain cereals and breads. ? Brown rice. ? Beans. ? Fresh fruits and vegetables. Activity  Return to your normal activities as told by your health care provider. Ask your health care provider  what activities are safe for you.  Rest as much as possible. Try to rest or take a nap when your baby is sleeping.  Do not lift anything that is heavier than your baby or 10 lb (4.5 kg) until your health care provider says that it is safe.  Talk with your health care provider about when you can engage in sexual activity. This may depend on your: ? Risk of infection. ? Rate of healing. ? Comfort and desire to engage in sexual activity. Vaginal Care  If you have an episiotomy, vaginal tear, or abdominal incision, check the area every day for signs of infection. Check for: ? More redness, swelling, or pain. ? More fluid or blood. ? Warmth. ? Pus or a bad smell.  Do not use tampons or douches or have intercourse x 6 weeks  Watch for any blood clots that may pass from your vagina. These may look like clumps of dark red, brown, or black discharge. General instructions  Keep your perineum clean and dry as told by your health care provider.  Wear loose, comfortable clothing.  Wipe from front to back when you use the toilet.  Ask your health care provider if you can shower or take a bath. If you had an episiotomy or a perineal tear during labor and delivery, your health care provider may tell you not to take baths for a certain length of time.  Wear a bra that supports your breasts and fits you well.  If possible, have someone help you with household activities and help care for your baby for at least a few days after you leave  the hospital.  Keep all follow-up visits for you and your baby as told by your health care provider. This is important. Contact a health care provider if:  You have: ? Vaginal discharge that has a bad smell. ? Difficulty urinating. ? Pain when urinating. ? A sudden increase or decrease in the frequency of your bowel movements. ? More redness, swelling, or pain around your vaginal tear or your tubal incision ? More fluid or blood coming from your tubal  incision ? Pus or a bad smell coming from your vaginal tear or tubal incision ? A fever of 100.5 or more ? A rash. ? Little or no interest in activities you used to enjoy. ? Questions about caring for yourself or your baby.  Your episiotomy or vaginal tear feels warm to the touch.  Your episiotomy or vaginal tear is separating or does not appear to be healing.  Your breasts are painful, hard, or turn red.  You feel unusually sad or worried.  You feel nauseous or you vomit.  You pass large blood clots from your vagina. If you pass a blood clot from your vagina, save it to show to your health care provider. Do not flush blood clots down the toilet without having your health care provider look at them.  You urinate more than usual.  You are dizzy or light-headed.  You have not breastfed at all and you have not had a menstrual period for 12 weeks after delivery.  You have stopped breastfeeding and you have not had a menstrual period for 12 weeks after you stopped breastfeeding. Get help right away if:  You have: ? Pain that does not go away or does not get better with medicine. ? Chest pain. ? Difficulty breathing. ? Blurred vision or spots in your vision. ? Thoughts about hurting yourself or your baby.  You develop pain in your abdomen or in one of your legs.  You develop a severe headache.  You faint.  You bleed from your vagina so much that you fill two sanitary pads in one hour. This information is not intended to replace advice given to you by your health care provider. Make sure you discuss any questions you have with your health care provider. Document Released: 12/25/1999 Document Revised: 06/10/2015 Document Reviewed: 01/11/2015 Elsevier Interactive Patient Education  2017 [employer]

## 2016-07-22 NOTE — Anesthesia Postprocedure Evaluation (Signed)
Anesthesia Post Note  Patient: Latasha Waters  Procedure(s) Performed: Procedure(s) (LRB): POST PARTUM TUBAL LIGATION (Bilateral)  Patient location during evaluation: PACU Anesthesia Type: Spinal Level of consciousness: awake and alert Pain management: pain level controlled Vital Signs Assessment: post-procedure vital signs reviewed and stable Respiratory status: spontaneous breathing, nonlabored ventilation, respiratory function stable and patient connected to nasal cannula oxygen Cardiovascular status: blood pressure returned to baseline and stable Postop Assessment: no signs of nausea or vomiting Anesthetic complications: no     Last Vitals:  Vitals:   07/22/16 0736 07/22/16 0851  BP: (!) 153/85 97/64  Pulse: 76 75  Resp:  18  Temp: 36.9 C 36.9 C    Last Pain:  Vitals:   07/22/16 1030  TempSrc:   PainSc: 2                  Lenard SimmerAndrew Cleophus Mendonsa

## 2016-07-22 NOTE — Telephone Encounter (Signed)
Pt is calling to find out what appointment she needs to schedule for. Pt delivered 07/20/16. Please advise

## 2016-07-22 NOTE — Lactation Note (Signed)
This note was copied from a baby's chart. Lactation Consultation Note  Patient Name: Boy Okey DupreJael Noe [id number]'s Date: 07/22/2016 Reason for consult: Other (Comment) (requests manual breast pump) I did not observe a feeding, pt requested breast pump for home use, [facility], encouraged mom to breastfeed first before supplementing with formula, give small amts of formula  Maternal Data Formula Feeding for Exclusion: No Does the patient have breastfeeding experience prior to this delivery?: Yes  Feeding Feeding Type: Bottle Fed - Formula Nipple Type: Slow - flow Length of feed: 30 min  LATCH Score/Interventions                      Lactation Tools Discussed/Used WIC Program: Yes Pump Review: Setup, frequency, and cleaning;Milk Storage Initiated by:: Cay SchillingsM Elchanan Bob RNC IBCLC Date initiated:: 07/22/16   Consult Status Consult Status: PRN    Dyann KiefMarsha D Nanetta Wiegman 07/22/2016, 12:57 PM

## 2016-07-22 NOTE — Telephone Encounter (Signed)
6 weeks postpartum visit, she probably needs a 1 week depression check as well

## 2016-07-24 ENCOUNTER — Emergency Department
Admission: EM | Admit: 2016-07-24 | Discharge: 2016-07-24 | Disposition: A | Discharge status: Home / Self Care | Payer: Medicaid Other | Attending: Emergency Medicine | Admitting: Emergency Medicine

## 2016-07-24 ENCOUNTER — Encounter: Payer: Self-pay | Admitting: Emergency Medicine

## 2016-07-24 DIAGNOSIS — R519 Headache, unspecified: Secondary | ICD-10-CM

## 2016-07-24 DIAGNOSIS — Z87891 Personal history of nicotine dependence: Secondary | ICD-10-CM | POA: Diagnosis not present

## 2016-07-24 DIAGNOSIS — J45909 Unspecified asthma, uncomplicated: Secondary | ICD-10-CM | POA: Insufficient documentation

## 2016-07-24 DIAGNOSIS — Z79899 Other long term (current) drug therapy: Secondary | ICD-10-CM | POA: Diagnosis not present

## 2016-07-24 DIAGNOSIS — R51 Headache: Secondary | ICD-10-CM | POA: Diagnosis not present

## 2016-07-24 MED ORDER — BUTALBITAL-APAP-CAFFEINE 50-325-40 MG PO TABS
1.0000 | ORAL_TABLET | Freq: Once | ORAL | Status: AC
  Administered 2016-07-24: 1 via ORAL
  Filled 2016-07-24: qty 1

## 2016-07-24 MED ORDER — ACETAMINOPHEN 500 MG PO TABS
ORAL_TABLET | ORAL | Status: AC
  Administered 2016-07-24: 1000 mg via ORAL
  Filled 2016-07-24: qty 2

## 2016-07-24 MED ORDER — PROCHLORPERAZINE EDISYLATE 5 MG/ML IJ SOLN
10.0000 mg | Freq: Once | INTRAMUSCULAR | Status: AC
  Administered 2016-07-24: 10 mg via INTRAVENOUS
  Filled 2016-07-24: qty 2

## 2016-07-24 MED ORDER — ACETAMINOPHEN 500 MG PO TABS
1000.0000 mg | ORAL_TABLET | Freq: Once | ORAL | Status: AC
Start: 2016-07-24 — End: 2016-07-24
  Administered 2016-07-24: 1000 mg via ORAL

## 2016-07-24 MED ORDER — SODIUM CHLORIDE 0.9 % IV BOLUS (SEPSIS)
1000.0000 mL | Freq: Once | INTRAVENOUS | Status: AC
  Administered 2016-07-24: 1000 mL via INTRAVENOUS

## 2016-07-24 NOTE — Discharge Instructions (Signed)
Please seek medical attention for any high fevers, chest pain, shortness of breath, change in behavior, persistent vomiting, bloody stool or any other new or concerning symptoms.  

## 2016-07-24 NOTE — ED Notes (Signed)
Family called asking about wait.  Offered to see if doctor will order pain medicine while waiting. Explained no bed available at this time and process of ED.Marland Kitchen. Pt wants MD called to fix it now since she is here and waiting. Informed her has to see EDP first once in room and when they determine cause of headache any consult that may be needed can be called.  Pt wants to call ambulance to take her to other hospital.  Offered again to see if EDP will give something for pain while waiting but cannot do further until has a room and seen by doctor.  Pt willing to accept pain medication. Pt keeps repeating that she wants the doctor to come do a patch now.  Explained again that EDP has to see first to determine cause and then can call a consult if needed.  Will order pain meds per EDP verbal order.

## 2016-07-24 NOTE — ED Triage Notes (Signed)
NSVD on 7/11 and had tubal ligation on 7/12.  Arrives with c/o headache since Friday.  C?O pain to neck and head.

## 2016-07-24 NOTE — ED Notes (Signed)
AAOx3.  Skin warm and dry.  Comfort measures and patient reassured.  Continue to monitor.
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
## 2016-07-24 NOTE — ED Notes (Signed)
Spoke with dr Shaune Pollacklord about pt to make sure no protocols while waiting. No orders at this time per MD

## 2016-07-25 ENCOUNTER — Ambulatory Visit (INDEPENDENT_AMBULATORY_CARE_PROVIDER_SITE_OTHER): Payer: Medicaid Other | Admitting: Obstetrics & Gynecology

## 2016-07-25 ENCOUNTER — Telehealth: Payer: Self-pay

## 2016-07-25 ENCOUNTER — Encounter: Payer: Self-pay | Admitting: Obstetrics & Gynecology

## 2016-07-25 VITALS — BP 120/70 | HR 92 | Ht 64.0 in | Wt 193.0 lb

## 2016-07-25 DIAGNOSIS — O2943 Spinal and epidural anesthesia induced headache during pregnancy, third trimester: Secondary | ICD-10-CM | POA: Diagnosis not present

## 2016-07-25 MED ORDER — HYDROCORTISONE 1 % EX CREA: TOPICAL_CREAM | Freq: Three times a day (TID) | CUTANEOUS | Status: AC

## 2016-07-25 NOTE — Telephone Encounter (Signed)
Spoke w/pt. She received meds @ ER, but did not receive meds/rx to use if headache persists. ER told to f/u w/MD. Boneta LucksApt scheduled w/[facility] today @1 :50pm.

## 2016-07-25 NOTE — Progress Notes (Signed)
  Postoperative Follow-up Patient presents post op from vag delivery then PP BTL (both w separate epidurals) for term preg, 1 week ago.  Subjective: Patient reports all over headache radiating from front to back and worse when changing positions and standing and with activity.  No nausea, dizziness.  Percocet and Tylenol no help.  Context- after epidurals (spinal component).  H/A for 4 days now.  Objective: BP 120/70   Pulse 92   Ht 5\' 4"  (1.626 m)   Wt 193 lb (87.5 kg)   BMI 33.13 kg/m  Physical Exam  Constitutional: She is oriented to person, place, and time. She appears well-developed and well-nourished. No distress.  Musculoskeletal: Normal range of motion.  Neurological: She is alert and oriented to person, place, and time.  Skin: Skin is warm and dry.  Psychiatric: She has a normal mood and affect.  Vitals reviewed.   Assessment: s/p :  NSVD and tubal ligation with HEADACHE, possibly related to EPIDURAL/ Spinal  Plan: D/w anesthesiology for possible eval and blood patch Monitor for signs of other etiologies Rest, Meds Change meds for hemorrhoids as still painful and bleeding  Letitia LibraRobert Paul Yeshaya Vath 07/25/2016, 2:13 PM

## 2016-07-25 NOTE — Telephone Encounter (Signed)
Pt had to go to ER yesterday who instructed her to f/u w/us for her headache & neck pain d/t anesthia. [id number]#[phone]#[phone].

## 2016-07-26 ENCOUNTER — Ambulatory Visit (HOSPITAL_BASED_OUTPATIENT_CLINIC_OR_DEPARTMENT_OTHER): Payer: Medicaid Other | Admitting: Anesthesiology

## 2016-07-26 ENCOUNTER — Ambulatory Visit
Admission: RE | Admit: 2016-07-26 | Discharge: 2016-07-26 | Disposition: A | Discharge status: Home / Self Care | Payer: Medicaid Other | Source: Ambulatory Visit | Attending: Anesthesiology | Admitting: Anesthesiology

## 2016-07-26 ENCOUNTER — Other Ambulatory Visit: Payer: Self-pay | Admitting: Anesthesiology

## 2016-07-26 ENCOUNTER — Encounter: Payer: Self-pay | Admitting: Anesthesiology

## 2016-07-26 VITALS — BP 124/72 | HR 62 | Temp 98.3°F | Resp 16 | Ht 64.0 in | Wt 188.0 lb

## 2016-07-26 DIAGNOSIS — Z8249 Family history of ischemic heart disease and other diseases of the circulatory system: Secondary | ICD-10-CM | POA: Insufficient documentation

## 2016-07-26 DIAGNOSIS — J45909 Unspecified asthma, uncomplicated: Secondary | ICD-10-CM | POA: Diagnosis not present

## 2016-07-26 DIAGNOSIS — R52 Pain, unspecified: Secondary | ICD-10-CM

## 2016-07-26 DIAGNOSIS — Z79899 Other long term (current) drug therapy: Secondary | ICD-10-CM | POA: Insufficient documentation

## 2016-07-26 DIAGNOSIS — Z87891 Personal history of nicotine dependence: Secondary | ICD-10-CM | POA: Insufficient documentation

## 2016-07-26 DIAGNOSIS — F4310 Post-traumatic stress disorder, unspecified: Secondary | ICD-10-CM | POA: Insufficient documentation

## 2016-07-26 DIAGNOSIS — Y844 Aspiration of fluid as the cause of abnormal reaction of the patient, or of later complication, without mention of misadventure at the time of the procedure: Secondary | ICD-10-CM | POA: Diagnosis not present

## 2016-07-26 DIAGNOSIS — G971 Other reaction to spinal and lumbar puncture: Secondary | ICD-10-CM

## 2016-07-26 NOTE — Progress Notes (Signed)
Safety precautions to be maintained throughout the outpatient stay will include: orient to surroundings, keep bed in low position, maintain call bell within reach at all times, provide assistance with transfer out of bed and ambulation.  
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
## 2016-07-28 ENCOUNTER — Telehealth: Payer: Self-pay | Admitting: *Deleted

## 2016-07-28 NOTE — Telephone Encounter (Signed)
Spoke with patient re; spinal headache.  Patient states that she is getting better and better every day and does not feel that an additional appt or a blood patch is warranted.  Reminded patient that it would still be important to rest, lying flat for a couple of hours 2 -3 times per day and continue to drink fluids as much as possible.  Patient verbalizes u/o information.

## 2016-08-05 ENCOUNTER — Encounter: Payer: Self-pay | Admitting: Obstetrics and Gynecology

## 2016-08-05 ENCOUNTER — Ambulatory Visit (INDEPENDENT_AMBULATORY_CARE_PROVIDER_SITE_OTHER): Payer: Medicaid Other | Admitting: Obstetrics and Gynecology

## 2016-08-05 VITALS — BP 110/80 | HR 77 | Wt 188.0 lb

## 2016-08-05 DIAGNOSIS — F53 Postpartum depression: Secondary | ICD-10-CM

## 2016-08-05 DIAGNOSIS — O99345 Other mental disorders complicating the puerperium: Principal | ICD-10-CM

## 2016-08-05 MED ORDER — IBUPROFEN 600 MG PO TABS: 600.0000 mg | ORAL_TABLET | Freq: Four times a day (QID) | ORAL | 2 refills | Status: AC | PRN

## 2016-08-05 MED ORDER — HYDROCORTISONE ACE-PRAMOXINE 2.5-1 % RE CREA: 1.0000 "application " | TOPICAL_CREAM | Freq: Four times a day (QID) | RECTAL | 1 refills | Status: AC

## 2016-08-05 MED ORDER — BUTALBITAL-APAP-CAFFEINE 50-325-40 MG PO TABS: 1.0000 | ORAL_TABLET | Freq: Four times a day (QID) | ORAL | 0 refills | Status: AC | PRN

## 2016-08-05 MED ORDER — DOCUSATE SODIUM 100 MG PO CAPS: 100.0000 mg | ORAL_CAPSULE | Freq: Two times a day (BID) | ORAL | 2 refills | Status: AC | PRN
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
## 2016-08-09 ENCOUNTER — Telehealth: Payer: Self-pay

## 2016-08-09 NOTE — Telephone Encounter (Signed)
Patient was supposed to have a blood patch on 07/26/16, she came in but ended up not having it. She is calling back saying she still has a headache and wants to come in for the procedure. At this point what procedure should I schedule for her, and I need a date and time for her to come in, as Dr. Pernell DupreAdams is booked. Thanks.

## 2016-08-09 NOTE — Telephone Encounter (Signed)
Called to patient about her c/o headache.  She is also c/o neck pain.  States that Dr Bonney AidStaebler has prescribed her "caffeine" pills which are not helping.  Instructed patient that this far out it is unlikely that it is the lumbar puncture that is causing her headache and neck pain along with it maybe coming from something else.  Instructed to call her OB for further evaluation and if they feel that she needs to be seen by pain management they can submit another referral. Patient verbalizes u/o information.

## 2016-08-29 ENCOUNTER — Encounter: Payer: Self-pay | Admitting: Obstetrics & Gynecology
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
## 2016-09-02 ENCOUNTER — Ambulatory Visit: Payer: Medicaid Other | Admitting: Obstetrics and Gynecology

## 2016-09-14 IMAGING — CT CT ABD-PELV W/ CM
2 of 4 series · 16 of 46 positions shown, 18 images · IV contrast (isovue)
Comparison: 10/08/2010

CLINICAL DATA: Severe mid abdominal pain since 11/29/2013, no known
injury

EXAM:
CT ABDOMEN AND PELVIS WITH CONTRAST
TECHNIQUE: Multidetector CT imaging of the abdomen and pelvis was performed
using the standard protocol following bolus administration of
intravenous contrast.
CONTRAST:  Dilute oral contrast.  100 cc Isovue 300 IV.

[Series 2: routine with · axial · 0.71mm/px · z∈[-878,-442]mm · 13 of 95 slices shown, 15 images]
[im 4/95  soft-tissue]
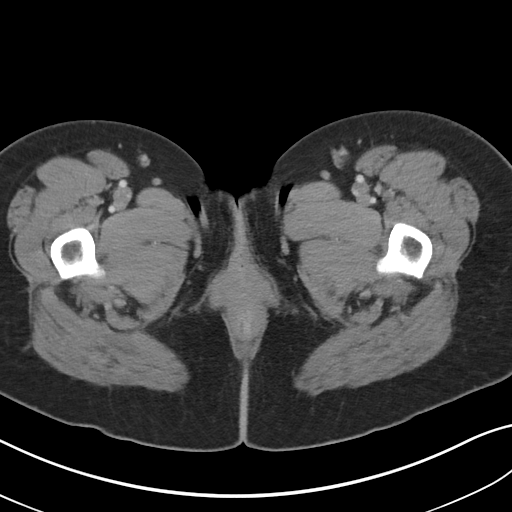
[im 4/95  bone]
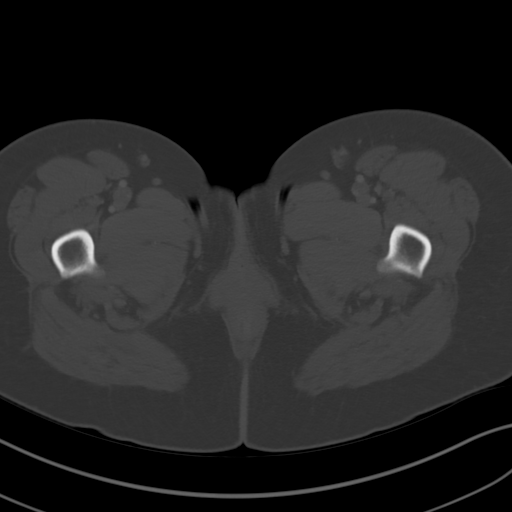
[im 12/95  soft-tissue]
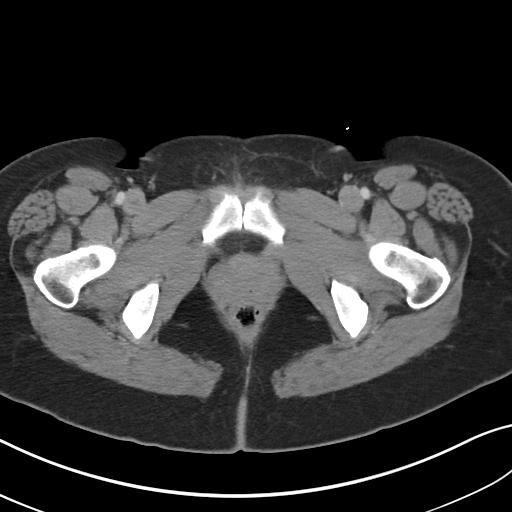
[im 20/95  soft-tissue]
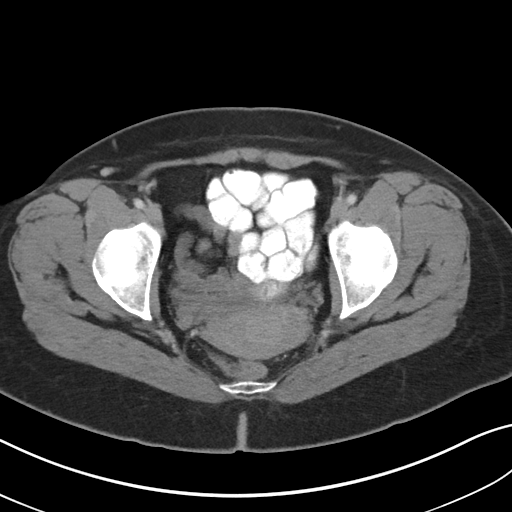
[im 28/95  soft-tissue]
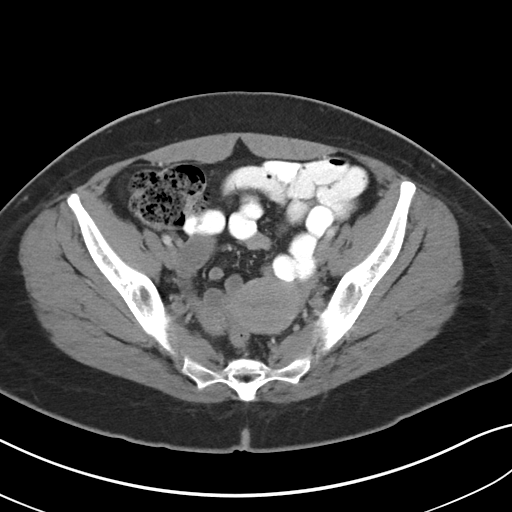
[im 32/95  soft-tissue]
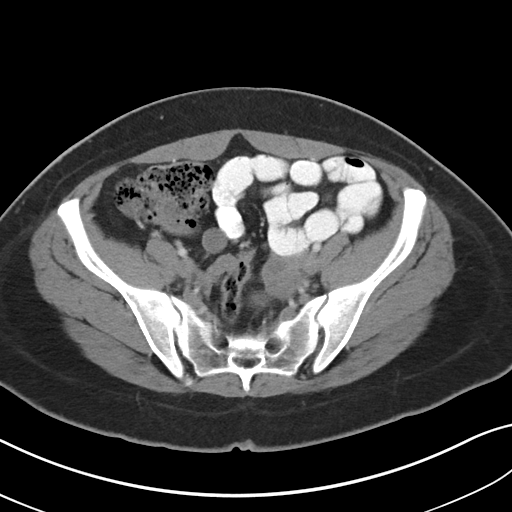
[im 40/95  soft-tissue]
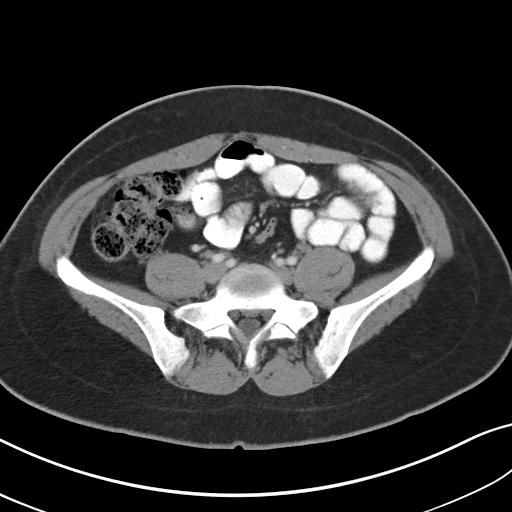
[im 48/95  soft-tissue]
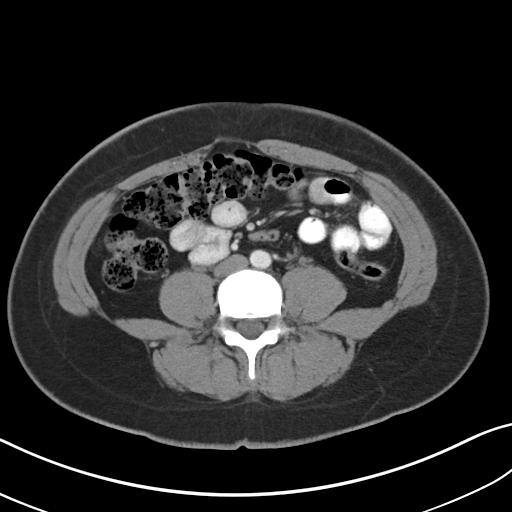
[im 55/95  soft-tissue]
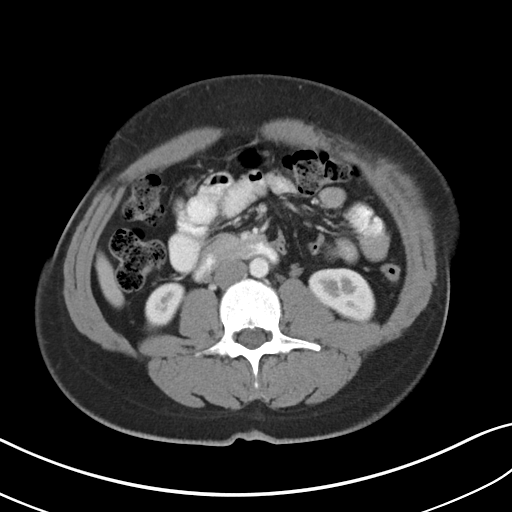
[im 63/95  soft-tissue]
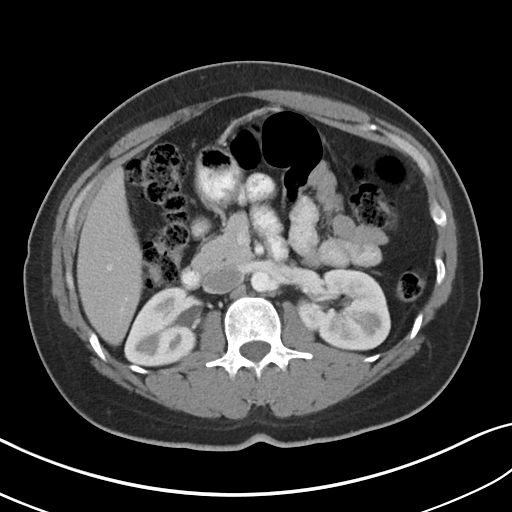
[im 63/95  bone]
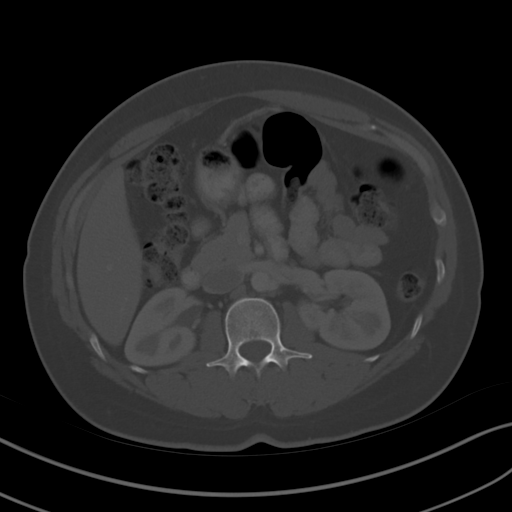
[im 67/95  soft-tissue]
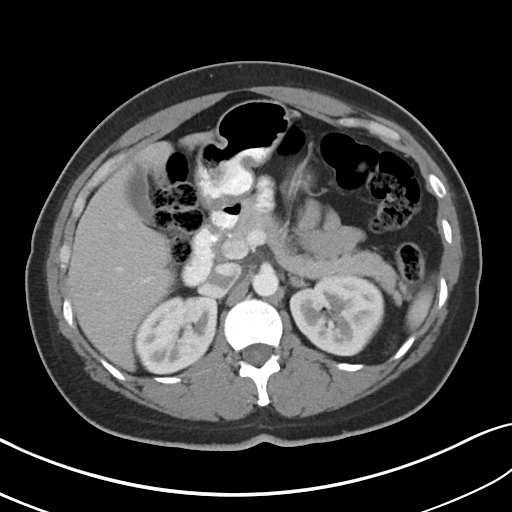
[im 75/95  soft-tissue]
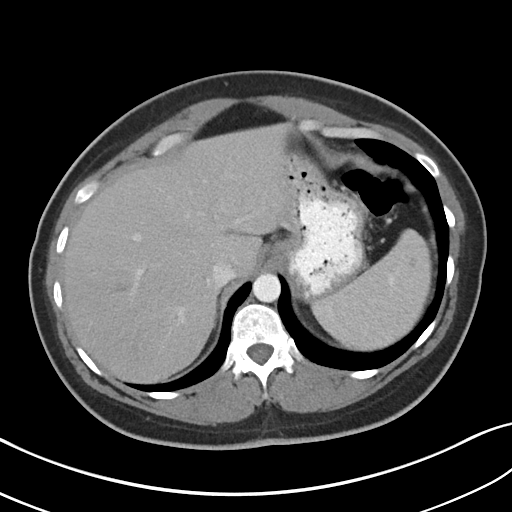
[im 83/95  soft-tissue]
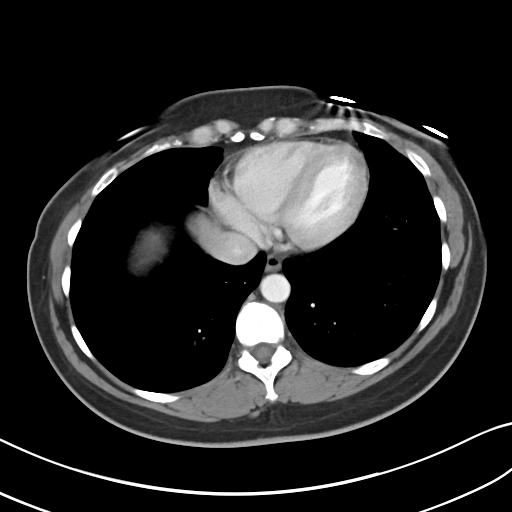
[im 91/95  soft-tissue]
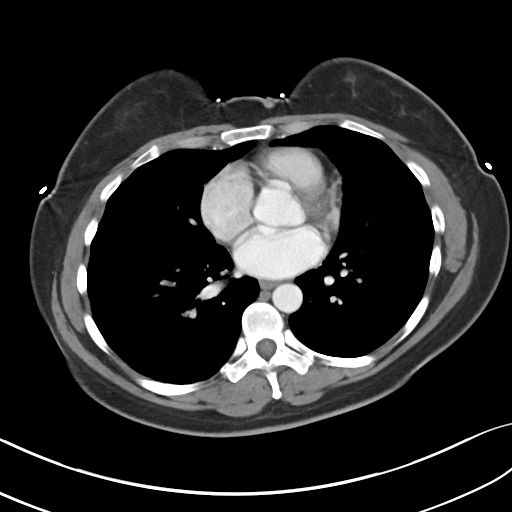

[Series 5: cor routine with · coronal · 0.79mm/px · 3 of 151 slices shown]
[im 51/151  soft-tissue]
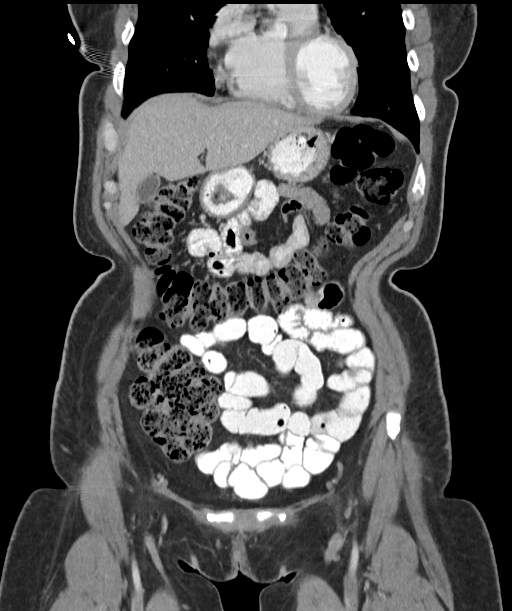
[im 67/151  soft-tissue]
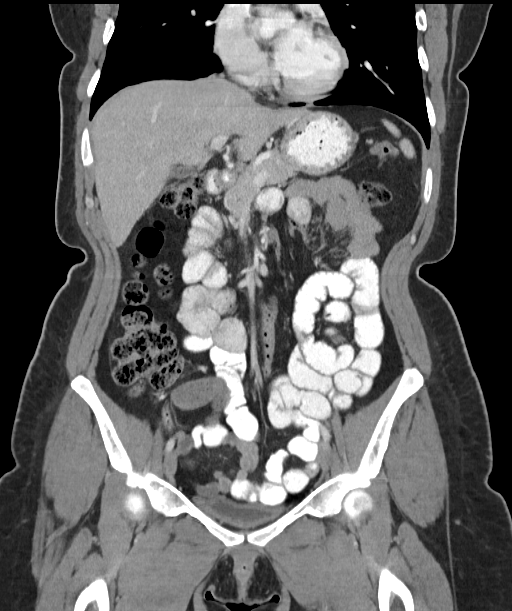
[im 84/151  soft-tissue]
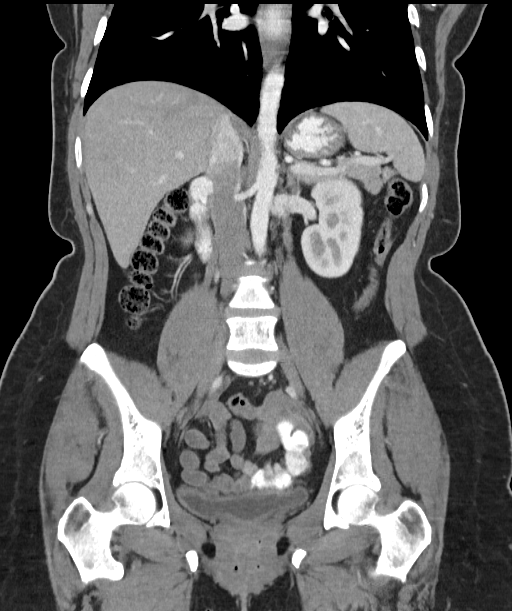

[16 of 46 positions shown; findings below may reference images not displayed]

FINDINGS: Lung bases clear.

Nonspecific 6 mm low-attenuation focus within spleen image 22.

Liver, spleen, pancreas, kidneys, and adrenal glands normal
appearance.

Stomach normal appearance.

Scattered stool throughout colon.

Sigmoid colon loop is displaced, traversing small bowel mesentery
and extending cranially to the posterior aspect of the transverse
mesocolon consistent with an internal hernia through a small bowel
mesenteric defect, new since prior exam.

Sigmoid colon appears elongated and thin with stretching of
mesenteric vessels.

Mild gaseous distention of sigmoid loop, displacing transverse
mesocolon anteriorly.

No evidence of bowel wall thickening or bowel obstruction
identified.

Small bowel loops otherwise normal appearance.

Appendix not identified.

Unremarkable bladder, ureters, uterus and adnexae.

No mass, adenopathy, free fluid, or free air.

Osseous structures unremarkable.
IMPRESSION: Internal hernia with sigmoid colon traversing a small bowel
mesenteric defect into upper central abdomen, lying posterior to the
transverse mesocolon, new since previous exam.

No evidence of bowel obstruction or perforation.

6 mm nonspecific low-attenuation focus within spleen.

## 2017-02-15 ENCOUNTER — Telehealth: Payer: Self-pay

## 2017-02-15 NOTE — Telephone Encounter (Signed)
Pt calling today needing proof that she got the TDAP when pregnant. Printing out and leaving up front for her. She will come today around 2:00 to p/u

## 2017-08-16 ENCOUNTER — Emergency Department: Payer: BLUE CROSS/BLUE SHIELD

## 2017-08-16 ENCOUNTER — Encounter: Payer: Self-pay | Admitting: Emergency Medicine

## 2017-08-16 ENCOUNTER — Other Ambulatory Visit: Payer: Self-pay

## 2017-08-16 ENCOUNTER — Emergency Department
Admission: EM | Admit: 2017-08-16 | Discharge: 2017-08-16 | Disposition: A | Discharge status: Home / Self Care | Payer: BLUE CROSS/BLUE SHIELD | Attending: Emergency Medicine | Admitting: Emergency Medicine

## 2017-08-16 DIAGNOSIS — R0789 Other chest pain: Secondary | ICD-10-CM | POA: Diagnosis not present

## 2017-08-16 DIAGNOSIS — R42 Dizziness and giddiness: Secondary | ICD-10-CM | POA: Diagnosis not present

## 2017-08-16 DIAGNOSIS — R1010 Upper abdominal pain, unspecified: Secondary | ICD-10-CM | POA: Insufficient documentation

## 2017-08-16 DIAGNOSIS — J45909 Unspecified asthma, uncomplicated: Secondary | ICD-10-CM | POA: Diagnosis not present

## 2017-08-16 DIAGNOSIS — Z87891 Personal history of nicotine dependence: Secondary | ICD-10-CM | POA: Insufficient documentation

## 2017-08-16 LAB — CBC
HCT: 38.7 % (ref 35.0–47.0)
HEMOGLOBIN: 13.2 g/dL (ref 12.0–16.0)
MCH: 29.4 pg (ref 26.0–34.0)
MCHC: 34.2 g/dL (ref 32.0–36.0)
MCV: 86 fL (ref 80.0–100.0)
PLATELETS: 238 10*3/uL (ref 150–440)
RBC: 4.5 MIL/uL (ref 3.80–5.20)
RDW: 13.8 % (ref 11.5–14.5)
WBC: 8.4 10*3/uL (ref 3.6–11.0)

## 2017-08-16 LAB — BASIC METABOLIC PANEL
Anion gap: 7 (ref 5–15)
BUN: 8 mg/dL (ref 6–20)
CHLORIDE: 108 mmol/L (ref 98–111)
CO2: 22 mmol/L (ref 22–32)
CREATININE: 0.51 mg/dL (ref 0.44–1.00)
Calcium: 8.7 mg/dL — ABNORMAL LOW (ref 8.9–10.3)
GFR calc non Af Amer: >60 mL/min (ref >60)
Glucose, Bld: 94 mg/dL (ref 70–99)
Potassium: 3.7 mmol/L (ref 3.5–5.1)
Sodium: 137 mmol/L (ref 135–145)

## 2017-08-16 LAB — TROPONIN I: Troponin I: <0.03 ng/mL (ref <0.03)

## 2017-08-16 LAB — TSH: TSH: 0.343 u[IU]/mL — ABNORMAL LOW (ref 0.350–4.500)

## 2017-08-16 LAB — T4, FREE: Free T4: 0.9 ng/dL (ref 0.82–1.77)

## 2017-08-16 MED ORDER — MECLIZINE HCL 25 MG PO TABS
50.0000 mg | ORAL_TABLET | Freq: Once | ORAL | Status: AC
  Administered 2017-08-16: 50 mg via ORAL
  Filled 2017-08-16: qty 2

## 2017-08-16 MED ORDER — MECLIZINE HCL 25 MG PO TABS
ORAL_TABLET | ORAL | Status: AC
  Administered 2017-08-16: 50 mg via ORAL
  Filled 2017-08-16: qty 2

## 2017-08-16 MED ORDER — KETOROLAC TROMETHAMINE 10 MG PO TABS
10.0000 mg | ORAL_TABLET | Freq: Once | ORAL | Status: AC
  Administered 2017-08-16: 10 mg via ORAL
  Filled 2017-08-16: qty 1

## 2017-08-16 MED ORDER — IOHEXOL 350 MG/ML SOLN
100.0000 mL | Freq: Once | INTRAVENOUS | Status: AC | PRN
  Administered 2017-08-16: 100 mL via INTRAVENOUS
  Filled 2017-08-16: qty 100

## 2017-08-16 MED ORDER — MECLIZINE HCL 25 MG PO TABS: 25.0000 mg | ORAL_TABLET | Freq: Three times a day (TID) | ORAL | 1 refills | Status: DC | PRN

## 2017-08-16 MED ORDER — KETOROLAC TROMETHAMINE 10 MG PO TABS
ORAL_TABLET | ORAL | Status: AC
  Administered 2017-08-16: 10 mg via ORAL
  Filled 2017-08-16: qty 1

## 2017-08-16 MED ORDER — SODIUM CHLORIDE 0.9 % IV BOLUS
1000.0000 mL | Freq: Once | INTRAVENOUS | Status: AC
  Administered 2017-08-16: 1000 mL via INTRAVENOUS

## 2017-08-16 NOTE — Discharge Instructions (Addendum)
Your CT scans of the head, chest, abdomen were all unremarkable.  Your lab test today were okay as well.  Continue meclizine as needed and follow-up with your doctor for continued monitoring of your symptoms.
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
## 2017-08-16 NOTE — ED Triage Notes (Signed)
Pt in via POV with complaints of chest pain w/ associated shortness of breath, beginning today while at work.  Pt also reports headache and vertigo w/ hx of same.  Pt appears lethargic.

## 2018-02-07 ENCOUNTER — Other Ambulatory Visit: Payer: Self-pay

## 2018-02-07 ENCOUNTER — Encounter: Payer: Self-pay | Admitting: Emergency Medicine

## 2018-02-07 ENCOUNTER — Emergency Department
Admission: EM | Admit: 2018-02-07 | Discharge: 2018-02-07 | Disposition: A | Discharge status: Home / Self Care | Payer: Medicaid Other | Attending: Emergency Medicine | Admitting: Emergency Medicine

## 2018-02-07 ENCOUNTER — Emergency Department: Payer: Medicaid Other

## 2018-02-07 DIAGNOSIS — R05 Cough: Secondary | ICD-10-CM | POA: Diagnosis present

## 2018-02-07 DIAGNOSIS — J101 Influenza due to other identified influenza virus with other respiratory manifestations: Secondary | ICD-10-CM

## 2018-02-07 DIAGNOSIS — Z87891 Personal history of nicotine dependence: Secondary | ICD-10-CM | POA: Insufficient documentation

## 2018-02-07 DIAGNOSIS — J45909 Unspecified asthma, uncomplicated: Secondary | ICD-10-CM | POA: Insufficient documentation

## 2018-02-07 DIAGNOSIS — E876 Hypokalemia: Secondary | ICD-10-CM | POA: Diagnosis not present

## 2018-02-07 LAB — CBC
HEMATOCRIT: 41.9 % (ref 36.0–46.0)
Hemoglobin: 13.7 g/dL (ref 12.0–15.0)
MCH: 28.6 pg (ref 26.0–34.0)
MCHC: 32.7 g/dL (ref 30.0–36.0)
MCV: 87.5 fL (ref 80.0–100.0)
NRBC: 0 % (ref 0.0–0.2)
Platelets: 214 10*3/uL (ref 150–400)
RBC: 4.79 MIL/uL (ref 3.87–5.11)
RDW: 13.3 % (ref 11.5–15.5)
WBC: 6.7 10*3/uL (ref 4.0–10.5)

## 2018-02-07 LAB — TROPONIN I: Troponin I: <0.03 ng/mL (ref <0.03)

## 2018-02-07 LAB — BASIC METABOLIC PANEL
ANION GAP: 10 (ref 5–15)
BUN: 8 mg/dL (ref 6–20)
CHLORIDE: 102 mmol/L (ref 98–111)
CO2: 23 mmol/L (ref 22–32)
Calcium: 8.5 mg/dL — ABNORMAL LOW (ref 8.9–10.3)
Creatinine, Ser: 0.64 mg/dL (ref 0.44–1.00)
GFR calc non Af Amer: >60 mL/min (ref >60)
GLUCOSE: 103 mg/dL — AB (ref 70–99)
Potassium: 3.3 mmol/L — ABNORMAL LOW (ref 3.5–5.1)
Sodium: 135 mmol/L (ref 135–145)

## 2018-02-07 MED ORDER — LOPERAMIDE HCL 2 MG PO TABS: 2.0000 mg | ORAL_TABLET | Freq: Four times a day (QID) | ORAL | 0 refills | Status: DC | PRN

## 2018-02-07 MED ORDER — ONDANSETRON HCL 4 MG/2ML IJ SOLN
4.0000 mg | Freq: Once | INTRAMUSCULAR | Status: AC
  Administered 2018-02-07: 4 mg via INTRAVENOUS
  Filled 2018-02-07: qty 2

## 2018-02-07 MED ORDER — BENZONATATE 100 MG PO CAPS: 100.0000 mg | ORAL_CAPSULE | Freq: Four times a day (QID) | ORAL | 0 refills | Status: DC | PRN

## 2018-02-07 MED ORDER — ACETAMINOPHEN 325 MG PO TABS
650.0000 mg | ORAL_TABLET | Freq: Once | ORAL | Status: AC | PRN
  Administered 2018-02-07: 650 mg via ORAL
  Filled 2018-02-07: qty 2

## 2018-02-07 MED ORDER — ONDANSETRON 4 MG PO TBDP: 4.0000 mg | ORAL_TABLET | Freq: Three times a day (TID) | ORAL | 0 refills | Status: DC | PRN

## 2018-02-07 MED ORDER — POTASSIUM CHLORIDE CRYS ER 20 MEQ PO TBCR
40.0000 meq | EXTENDED_RELEASE_TABLET | Freq: Once | ORAL | Status: AC
  Administered 2018-02-07: 40 meq via ORAL
  Filled 2018-02-07: qty 2

## 2018-02-07 MED ORDER — KETOROLAC TROMETHAMINE 30 MG/ML IJ SOLN
30.0000 mg | Freq: Once | INTRAMUSCULAR | Status: AC
  Administered 2018-02-07: 30 mg via INTRAVENOUS
  Filled 2018-02-07: qty 1

## 2018-02-07 MED ORDER — IPRATROPIUM-ALBUTEROL 0.5-2.5 (3) MG/3ML IN SOLN
3.0000 mL | Freq: Once | RESPIRATORY_TRACT | Status: AC
  Administered 2018-02-07: 3 mL via RESPIRATORY_TRACT
  Filled 2018-02-07: qty 3

## 2018-02-07 MED ORDER — BENZONATATE 100 MG PO CAPS
200.0000 mg | ORAL_CAPSULE | Freq: Once | ORAL | Status: AC
  Administered 2018-02-07: 200 mg via ORAL
  Filled 2018-02-07: qty 2

## 2018-02-07 MED ORDER — SODIUM CHLORIDE 0.9 % IV BOLUS
1000.0000 mL | Freq: Once | INTRAVENOUS | Status: AC
  Administered 2018-02-07: 1000 mL via INTRAVENOUS

## 2018-02-07 MED ORDER — ACETAMINOPHEN 500 MG PO TABS
1000.0000 mg | ORAL_TABLET | Freq: Once | ORAL | Status: DC
  Filled 2018-02-07: qty 2

## 2018-02-07 MED ORDER — ONDANSETRON 4 MG PO TBDP
4.0000 mg | ORAL_TABLET | Freq: Once | ORAL | Status: AC
  Administered 2018-02-07: 4 mg via ORAL
  Filled 2018-02-07: qty 1
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
## 2018-02-07 NOTE — ED Triage Notes (Signed)
Pt in via POV; sent over from [facility] for possible dehydration.  Pt positive for Influenza A.  Pt reports fever, dizziness and chest discomfort since Monday.  Pt A/Ox4, febrile upon arrivale, NAD noted at this time.

## 2018-02-07 NOTE — Discharge Instructions (Addendum)
History of plenty of fluid to stay well-hydrated.  You may take Tessalon Perles and your albuterol inhaler for coughing spasms.  Zofran is for nausea and vomiting and loperamide is for your diarrhea.  Please wash your hands frequently and thoroughly to prevent the spread of infection.  Return to the emergency department if you develop severe pain, lightheadedness or fainting, inability to keep down fluids, or any other symptoms concerning to you.

## 2018-07-18 ENCOUNTER — Other Ambulatory Visit: Payer: Self-pay

## 2018-07-18 ENCOUNTER — Emergency Department
Admission: EM | Admit: 2018-07-18 | Discharge: 2018-07-18 | Discharge status: Absent without leave | Payer: Medicaid Other

## 2018-08-24 ENCOUNTER — Other Ambulatory Visit: Payer: Self-pay

## 2018-08-24 DIAGNOSIS — Z20822 Contact with and (suspected) exposure to covid-19: Secondary | ICD-10-CM

## 2018-08-26 LAB — NOVEL CORONAVIRUS, NAA: SARS-CoV-2, NAA: NOT DETECTED

## 2019-01-22 ENCOUNTER — Emergency Department: Payer: Medicaid Other

## 2019-01-22 ENCOUNTER — Encounter: Payer: Self-pay | Admitting: Emergency Medicine

## 2019-01-22 ENCOUNTER — Emergency Department
Admission: EM | Admit: 2019-01-22 | Discharge: 2019-01-22 | Disposition: A | Discharge status: Home / Self Care | Payer: Medicaid Other | Attending: Emergency Medicine | Admitting: Emergency Medicine

## 2019-01-22 ENCOUNTER — Other Ambulatory Visit: Payer: Self-pay

## 2019-01-22 DIAGNOSIS — Z79899 Other long term (current) drug therapy: Secondary | ICD-10-CM | POA: Insufficient documentation

## 2019-01-22 DIAGNOSIS — Z87891 Personal history of nicotine dependence: Secondary | ICD-10-CM | POA: Diagnosis not present

## 2019-01-22 DIAGNOSIS — J45909 Unspecified asthma, uncomplicated: Secondary | ICD-10-CM | POA: Diagnosis not present

## 2019-01-22 DIAGNOSIS — R519 Headache, unspecified: Secondary | ICD-10-CM | POA: Insufficient documentation

## 2019-01-22 LAB — COMPREHENSIVE METABOLIC PANEL
ALT: 10 U/L (ref 0–44)
AST: 15 U/L (ref 15–41)
Albumin: 3.7 g/dL (ref 3.5–5.0)
Alkaline Phosphatase: 40 U/L (ref 38–126)
Anion gap: 7 (ref 5–15)
BUN: 6 mg/dL (ref 6–20)
CO2: 27 mmol/L (ref 22–32)
Calcium: 8.9 mg/dL (ref 8.9–10.3)
Chloride: 107 mmol/L (ref 98–111)
Creatinine, Ser: 0.55 mg/dL (ref 0.44–1.00)
GFR calc Af Amer: >60 mL/min (ref >60)
GFR calc non Af Amer: >60 mL/min (ref >60)
Glucose, Bld: 86 mg/dL (ref 70–99)
Potassium: 3.3 mmol/L — ABNORMAL LOW (ref 3.5–5.1)
Sodium: 141 mmol/L (ref 135–145)
Total Bilirubin: 0.7 mg/dL (ref 0.3–1.2)
Total Protein: 8 g/dL (ref 6.5–8.1)

## 2019-01-22 LAB — CBC WITH DIFFERENTIAL/PLATELET
Abs Immature Granulocytes: 0.02 10*3/uL (ref 0.00–0.07)
Basophils Absolute: 0 10*3/uL (ref 0.0–0.1)
Basophils Relative: 1 %
Eosinophils Absolute: 0.2 10*3/uL (ref 0.0–0.5)
Eosinophils Relative: 4 %
HCT: 37 % (ref 36.0–46.0)
Hemoglobin: 11.6 g/dL — ABNORMAL LOW (ref 12.0–15.0)
Immature Granulocytes: 0 %
Lymphocytes Relative: 26 %
Lymphs Abs: 1.5 10*3/uL (ref 0.7–4.0)
MCH: 24.9 pg — ABNORMAL LOW (ref 26.0–34.0)
MCHC: 31.4 g/dL (ref 30.0–36.0)
MCV: 79.6 fL — ABNORMAL LOW (ref 80.0–100.0)
Monocytes Absolute: 0.3 10*3/uL (ref 0.1–1.0)
Monocytes Relative: 5 %
Neutro Abs: 3.8 10*3/uL (ref 1.7–7.7)
Neutrophils Relative %: 64 %
Platelets: 213 10*3/uL (ref 150–400)
RBC: 4.65 MIL/uL (ref 3.87–5.11)
RDW: 14.6 % (ref 11.5–15.5)
WBC: 5.9 10*3/uL (ref 4.0–10.5)
nRBC: 0 % (ref 0.0–0.2)

## 2019-01-22 LAB — URINALYSIS, COMPLETE (UACMP) WITH MICROSCOPIC
Bilirubin Urine: NEGATIVE
Glucose, UA: NEGATIVE mg/dL
Ketones, ur: NEGATIVE mg/dL
Leukocytes,Ua: NEGATIVE
Nitrite: NEGATIVE
Protein, ur: NEGATIVE mg/dL
Specific Gravity, Urine: 1.005 (ref 1.005–1.030)
pH: 7 (ref 5.0–8.0)

## 2019-01-22 LAB — POCT PREGNANCY, URINE: Preg Test, Ur: NEGATIVE

## 2019-01-22 MED ORDER — FENTANYL CITRATE (PF) 100 MCG/2ML IJ SOLN
50.0000 ug | INTRAMUSCULAR | Status: DC | PRN
  Filled 2019-01-22: qty 2

## 2019-01-22 MED ORDER — SODIUM CHLORIDE 0.9 % IV BOLUS
1000.0000 mL | Freq: Once | INTRAVENOUS | Status: AC
  Administered 2019-01-22: 1000 mL via INTRAVENOUS

## 2019-01-22 MED ORDER — KETOROLAC TROMETHAMINE 30 MG/ML IJ SOLN
30.0000 mg | Freq: Once | INTRAMUSCULAR | Status: DC
  Filled 2019-01-22: qty 1

## 2019-01-22 MED ORDER — ONDANSETRON HCL 4 MG/2ML IJ SOLN
4.0000 mg | Freq: Once | INTRAMUSCULAR | Status: AC
  Administered 2019-01-22: 4 mg via INTRAVENOUS
  Filled 2019-01-22: qty 2

## 2019-01-22 MED ORDER — FENTANYL CITRATE (PF) 100 MCG/2ML IJ SOLN
50.0000 ug | INTRAMUSCULAR | Status: DC | PRN
  Administered 2019-01-22: 50 ug via INTRAVENOUS

## 2019-01-22 MED ORDER — ONDANSETRON 4 MG PO TBDP: 4.0000 mg | ORAL_TABLET | Freq: Once | ORAL | Status: DC

## 2019-01-22 MED ORDER — DIPHENHYDRAMINE HCL 50 MG/ML IJ SOLN
25.0000 mg | Freq: Once | INTRAMUSCULAR | Status: AC
  Administered 2019-01-22: 25 mg via INTRAVENOUS
  Filled 2019-01-22: qty 1

## 2019-01-22 MED ORDER — KETOROLAC TROMETHAMINE 30 MG/ML IJ SOLN
30.0000 mg | Freq: Once | INTRAMUSCULAR | Status: AC
  Administered 2019-01-22: 30 mg via INTRAVENOUS

## 2019-01-22 MED ORDER — METOCLOPRAMIDE HCL 5 MG/ML IJ SOLN
10.0000 mg | Freq: Once | INTRAMUSCULAR | Status: AC
  Administered 2019-01-22: 14:00:00 10 mg via INTRAVENOUS
  Filled 2019-01-22: qty 2

## 2019-01-22 NOTE — Discharge Instructions (Signed)
Follow-up with your primary care provider if any continued problems or additional medication as needed.  Increase fluids today.  Tylenol or ibuprofen as needed for any residual headache.  Return to the emergency department if any severe worsening of your symptoms or urgent concerns.

## 2019-01-22 NOTE — ED Triage Notes (Signed)
Pt in via EMS from [facility] where she received the Covid vaccine this am. Per EMS pt had a HA prior to getting the shot and once she got it her HA got much worse. 170/106

## 2019-01-22 NOTE — ED Notes (Signed)
Pt informed that provider would like to treat pain until CT results back.
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
## 2019-01-22 NOTE — ED Triage Notes (Signed)
Pt here with ha. Had a mild Ha prior to first COVID vaccine this am, now headache has worsened. Denies aura or light sensitivity. NAD.

## 2019-01-22 NOTE — ED Notes (Signed)
Pad not working for [employer].  Pt verbalized understanding of discharge.

## 2019-01-22 NOTE — ED Notes (Signed)
Urine and labs to be added on

## 2019-04-09 ENCOUNTER — Other Ambulatory Visit: Payer: Self-pay | Admitting: Orthopedic Surgery

## 2019-04-09 DIAGNOSIS — M259 Joint disorder, unspecified: Secondary | ICD-10-CM

## 2019-04-26 ENCOUNTER — Other Ambulatory Visit: Payer: Self-pay

## 2019-04-26 ENCOUNTER — Ambulatory Visit
Admission: RE | Admit: 2019-04-26 | Discharge: 2019-04-26 | Disposition: A | Discharge status: Home / Self Care | Payer: Worker's Compensation | Source: Ambulatory Visit | Attending: Orthopedic Surgery | Admitting: Orthopedic Surgery

## 2019-04-26 DIAGNOSIS — M259 Joint disorder, unspecified: Secondary | ICD-10-CM

## 2019-07-02 ENCOUNTER — Ambulatory Visit (LOCAL_COMMUNITY_HEALTH_CENTER): Payer: Self-pay

## 2019-07-02 ENCOUNTER — Other Ambulatory Visit: Payer: Self-pay

## 2019-07-02 DIAGNOSIS — Z111 Encounter for screening for respiratory tuberculosis: Secondary | ICD-10-CM

## 2019-07-05 ENCOUNTER — Other Ambulatory Visit: Payer: Self-pay

## 2019-07-05 ENCOUNTER — Ambulatory Visit (LOCAL_COMMUNITY_HEALTH_CENTER): Payer: Medicaid Other

## 2019-07-05 DIAGNOSIS — Z111 Encounter for screening for respiratory tuberculosis: Secondary | ICD-10-CM

## 2019-07-05 LAB — TB SKIN TEST: TB Skin Test: NEGATIVE

## 2019-10-10 ENCOUNTER — Emergency Department
Admission: EM | Admit: 2019-10-10 | Discharge: 2019-10-10 | Disposition: A | Discharge status: Home / Self Care | Payer: Medicaid Other

## 2019-10-10 NOTE — ED Notes (Signed)
[facility] c/o lower abdominal pain and pressure. VSS.

## 2020-02-14 ENCOUNTER — Other Ambulatory Visit: Payer: Self-pay | Admitting: Family Medicine

## 2020-02-14 DIAGNOSIS — R109 Unspecified abdominal pain: Secondary | ICD-10-CM

## 2020-02-14 DIAGNOSIS — Z01818 Encounter for other preprocedural examination: Secondary | ICD-10-CM

## 2020-02-19 ENCOUNTER — Other Ambulatory Visit: Payer: Self-pay | Admitting: Family Medicine

## 2020-02-19 DIAGNOSIS — R109 Unspecified abdominal pain: Secondary | ICD-10-CM

## 2020-02-19 DIAGNOSIS — Z01818 Encounter for other preprocedural examination: Secondary | ICD-10-CM

## 2020-03-11 ENCOUNTER — Ambulatory Visit: Admission: RE | Admit: 2020-03-11 | Payer: Medicaid Other | Source: Ambulatory Visit

## 2020-08-27 ENCOUNTER — Ambulatory Visit
Admission: RE | Admit: 2020-08-27 | Discharge: 2020-08-27 | Disposition: A | Discharge status: Home / Self Care | Payer: Medicaid Other | Attending: Family Medicine | Admitting: Family Medicine

## 2020-08-27 ENCOUNTER — Other Ambulatory Visit: Payer: Self-pay | Admitting: Family Medicine

## 2020-08-27 ENCOUNTER — Other Ambulatory Visit: Payer: Self-pay

## 2020-08-27 ENCOUNTER — Ambulatory Visit
Admission: RE | Admit: 2020-08-27 | Discharge: 2020-08-27 | Disposition: A | Discharge status: Home / Self Care | Payer: Medicaid Other | Source: Ambulatory Visit | Attending: Family Medicine | Admitting: Family Medicine

## 2020-08-27 DIAGNOSIS — R059 Cough, unspecified: Secondary | ICD-10-CM | POA: Diagnosis not present

## 2020-11-16 ENCOUNTER — Other Ambulatory Visit: Payer: Self-pay

## 2020-11-16 ENCOUNTER — Ambulatory Visit (LOCAL_COMMUNITY_HEALTH_CENTER): Payer: Medicaid Other

## 2020-11-16 DIAGNOSIS — Z111 Encounter for screening for respiratory tuberculosis: Secondary | ICD-10-CM

## 2020-11-16 NOTE — Progress Notes (Signed)
PPD placed today. Hart Carwin, RN

## 2020-11-19 ENCOUNTER — Other Ambulatory Visit: Payer: Self-pay

## 2020-11-19 ENCOUNTER — Ambulatory Visit (LOCAL_COMMUNITY_HEALTH_CENTER): Payer: Medicaid Other

## 2020-11-19 DIAGNOSIS — Z111 Encounter for screening for respiratory tuberculosis: Secondary | ICD-10-CM

## 2020-11-19 LAB — TB SKIN TEST
Induration: 0 mm
TB Skin Test: NEGATIVE

## 2020-11-19 NOTE — Progress Notes (Signed)
Patient in clinic for PPDR.  0 mm duration.  PPDR paper work given.    Wendi Snipes, FNP

## 2021-04-13 ENCOUNTER — Other Ambulatory Visit: Payer: Self-pay | Admitting: Family Medicine

## 2021-04-13 ENCOUNTER — Ambulatory Visit
Admission: RE | Admit: 2021-04-13 | Discharge: 2021-04-13 | Disposition: A | Discharge status: Home / Self Care | Payer: Medicaid Other | Source: Ambulatory Visit | Attending: Family Medicine | Admitting: Family Medicine

## 2021-04-13 ENCOUNTER — Ambulatory Visit
Admission: RE | Admit: 2021-04-13 | Discharge: 2021-04-13 | Disposition: A | Discharge status: Home / Self Care | Payer: Medicaid Other | Attending: Family Medicine | Admitting: Family Medicine

## 2021-04-13 DIAGNOSIS — R52 Pain, unspecified: Secondary | ICD-10-CM | POA: Insufficient documentation

## 2021-10-30 ENCOUNTER — Emergency Department: Payer: Medicaid Other

## 2021-10-30 ENCOUNTER — Emergency Department
Admission: EM | Admit: 2021-10-30 | Discharge: 2021-10-30 | Disposition: A | Discharge status: Home / Self Care | Payer: Medicaid Other | Attending: Emergency Medicine | Admitting: Emergency Medicine

## 2021-10-30 ENCOUNTER — Other Ambulatory Visit: Payer: Self-pay

## 2021-10-30 DIAGNOSIS — J45909 Unspecified asthma, uncomplicated: Secondary | ICD-10-CM | POA: Insufficient documentation

## 2021-10-30 DIAGNOSIS — R0789 Other chest pain: Secondary | ICD-10-CM | POA: Insufficient documentation

## 2021-10-30 DIAGNOSIS — R0602 Shortness of breath: Secondary | ICD-10-CM | POA: Insufficient documentation

## 2021-10-30 DIAGNOSIS — R059 Cough, unspecified: Secondary | ICD-10-CM | POA: Insufficient documentation

## 2021-10-30 DIAGNOSIS — R079 Chest pain, unspecified: Secondary | ICD-10-CM

## 2021-10-30 LAB — CBC WITH DIFFERENTIAL/PLATELET
Abs Immature Granulocytes: 0.02 10*3/uL (ref 0.00–0.07)
Basophils Absolute: 0 10*3/uL (ref 0.0–0.1)
Basophils Relative: 1 %
Eosinophils Absolute: 0.4 10*3/uL (ref 0.0–0.5)
Eosinophils Relative: 5 %
HCT: 40.1 % (ref 36.0–46.0)
Hemoglobin: 12.8 g/dL (ref 12.0–15.0)
Immature Granulocytes: 0 %
Lymphocytes Relative: 29 %
Lymphs Abs: 2.1 10*3/uL (ref 0.7–4.0)
MCH: 28.3 pg (ref 26.0–34.0)
MCHC: 31.9 g/dL (ref 30.0–36.0)
MCV: 88.7 fL (ref 80.0–100.0)
Monocytes Absolute: 0.4 10*3/uL (ref 0.1–1.0)
Monocytes Relative: 5 %
Neutro Abs: 4.4 10*3/uL (ref 1.7–7.7)
Neutrophils Relative %: 60 %
Platelets: 232 10*3/uL (ref 150–400)
RBC: 4.52 MIL/uL (ref 3.87–5.11)
RDW: 12.1 % (ref 11.5–15.5)
WBC: 7.3 10*3/uL (ref 4.0–10.5)
nRBC: 0 % (ref 0.0–0.2)

## 2021-10-30 LAB — COMPREHENSIVE METABOLIC PANEL
ALT: 10 U/L (ref 0–44)
AST: 13 U/L — ABNORMAL LOW (ref 15–41)
Albumin: 3.5 g/dL (ref 3.5–5.0)
Alkaline Phosphatase: 38 U/L (ref 38–126)
Anion gap: 8 (ref 5–15)
BUN: 12 mg/dL (ref 6–20)
CO2: 19 mmol/L — ABNORMAL LOW (ref 22–32)
Calcium: 8.3 mg/dL — ABNORMAL LOW (ref 8.9–10.3)
Chloride: 107 mmol/L (ref 98–111)
Creatinine, Ser: 0.61 mg/dL (ref 0.44–1.00)
GFR, Estimated: >60 mL/min (ref >60)
Glucose, Bld: 98 mg/dL (ref 70–99)
Potassium: 3.4 mmol/L — ABNORMAL LOW (ref 3.5–5.1)
Sodium: 134 mmol/L — ABNORMAL LOW (ref 135–145)
Total Bilirubin: 0.4 mg/dL (ref 0.3–1.2)
Total Protein: 7.5 g/dL (ref 6.5–8.1)

## 2021-10-30 LAB — D-DIMER, QUANTITATIVE: D-Dimer, Quant: 0.42 ug/mL-FEU (ref 0.00–0.50)

## 2021-10-30 LAB — TROPONIN I (HIGH SENSITIVITY): Troponin I (High Sensitivity): <2 ng/L (ref <18)

## 2021-10-30 MED ORDER — OXYCODONE-ACETAMINOPHEN 5-325 MG PO TABS
1.0000 | ORAL_TABLET | Freq: Once | ORAL | Status: AC
  Administered 2021-10-30: 1 via ORAL
  Filled 2021-10-30: qty 1

## 2021-10-30 MED ORDER — NAPROXEN 250 MG PO TABS: 250.0000 mg | ORAL_TABLET | Freq: Two times a day (BID) | ORAL | 0 refills | Status: AC

## 2021-10-30 NOTE — Discharge Instructions (Addendum)
Your blood work EKG and chest x-ray were all reassuring.  Please take the naproxen twice a day for pain and inflammation.  Please follow-up with your primary care doctor if symptoms or not improving.

## 2021-10-30 NOTE — ED Provider Triage Note (Signed)
Emergency Medicine Provider Triage Evaluation Note  Latasha Waters , a 46 y.o. female  was evaluated in triage.  Pt complains of chest pain, shortness of breath.  Patient had COVID 2 weeks ago..  Review of Systems  Positive:  Negative:   Physical Exam  BP 106/78   Pulse 81   Temp 98.7 F (37.1 C) (Oral)   SpO2 98%  Gen:   Awake, no distress   Resp:  Normal effort  MSK:   Moves extremities without difficulty  Other:    Medical Decision Making  Medically screening exam initiated at 5:06 PM.  Appropriate orders placed.  Latasha Waters was informed that the remainder of the evaluation will be completed by another provider, this initial triage assessment does not replace that evaluation, and the importance of remaining in the ED until their evaluation is complete.     [name], PA-C 10/30/21 1706

## 2021-10-30 NOTE — ED Triage Notes (Signed)
Pt arrives with c/o SOB that has gotten progressively worse over the last few weeks since she had covid. Pt endorses CP and headaches. Pt denies n/v or fevers.
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
## 2022-01-28 ENCOUNTER — Other Ambulatory Visit: Payer: Self-pay

## 2022-01-28 ENCOUNTER — Emergency Department: Payer: Medicaid Other

## 2022-01-28 DIAGNOSIS — J45909 Unspecified asthma, uncomplicated: Secondary | ICD-10-CM | POA: Insufficient documentation

## 2022-01-28 DIAGNOSIS — R0789 Other chest pain: Secondary | ICD-10-CM | POA: Diagnosis not present

## 2022-01-28 DIAGNOSIS — R519 Headache, unspecified: Secondary | ICD-10-CM | POA: Diagnosis present

## 2022-01-28 DIAGNOSIS — F430 Acute stress reaction: Secondary | ICD-10-CM | POA: Insufficient documentation

## 2022-01-28 DIAGNOSIS — E876 Hypokalemia: Secondary | ICD-10-CM | POA: Diagnosis not present

## 2022-01-28 LAB — COMPREHENSIVE METABOLIC PANEL
ALT: 12 U/L (ref 0–44)
AST: 16 U/L (ref 15–41)
Albumin: 3.7 g/dL (ref 3.5–5.0)
Alkaline Phosphatase: 41 U/L (ref 38–126)
Anion gap: 8 (ref 5–15)
BUN: 11 mg/dL (ref 6–20)
CO2: 24 mmol/L (ref 22–32)
Calcium: 8.7 mg/dL — ABNORMAL LOW (ref 8.9–10.3)
Chloride: 103 mmol/L (ref 98–111)
Creatinine, Ser: 0.58 mg/dL (ref 0.44–1.00)
GFR, Estimated: >60 mL/min (ref >60)
Glucose, Bld: 97 mg/dL (ref 70–99)
Potassium: 3.3 mmol/L — ABNORMAL LOW (ref 3.5–5.1)
Sodium: 135 mmol/L (ref 135–145)
Total Bilirubin: 0.4 mg/dL (ref 0.3–1.2)
Total Protein: 7.9 g/dL (ref 6.5–8.1)

## 2022-01-28 LAB — CBC
HCT: 38.6 % (ref 36.0–46.0)
Hemoglobin: 12.4 g/dL (ref 12.0–15.0)
MCH: 28 pg (ref 26.0–34.0)
MCHC: 32.1 g/dL (ref 30.0–36.0)
MCV: 87.1 fL (ref 80.0–100.0)
Platelets: 253 10*3/uL (ref 150–400)
RBC: 4.43 MIL/uL (ref 3.87–5.11)
RDW: 12.1 % (ref 11.5–15.5)
WBC: 7.3 10*3/uL (ref 4.0–10.5)
nRBC: 0 % (ref 0.0–0.2)

## 2022-01-28 LAB — POC URINE PREG, ED: Preg Test, Ur: NEGATIVE

## 2022-01-28 LAB — TROPONIN I (HIGH SENSITIVITY): Troponin I (High Sensitivity): <2 ng/L (ref <18)

## 2022-01-28 NOTE — ED Triage Notes (Signed)
Pt presents to ED via [facility] c/o headache and left arm pain. Pt states she was at work and suddenly had a stabbing/shooting pain down left arm and then developed a headache. She also reports that her blood pressure was high, no hx of high blood pressure. Denies CP, SOB

## 2022-01-29 ENCOUNTER — Emergency Department: Payer: Medicaid Other

## 2022-01-29 ENCOUNTER — Emergency Department
Admission: EM | Admit: 2022-01-29 | Discharge: 2022-01-29 | Disposition: A | Discharge status: Home / Self Care | Payer: Medicaid Other | Attending: Emergency Medicine | Admitting: Emergency Medicine

## 2022-01-29 DIAGNOSIS — R519 Headache, unspecified: Secondary | ICD-10-CM

## 2022-01-29 DIAGNOSIS — F430 Acute stress reaction: Secondary | ICD-10-CM

## 2022-01-29 DIAGNOSIS — E876 Hypokalemia: Secondary | ICD-10-CM

## 2022-01-29 DIAGNOSIS — R0789 Other chest pain: Secondary | ICD-10-CM

## 2022-01-29 LAB — TROPONIN I (HIGH SENSITIVITY): Troponin I (High Sensitivity): 3 ng/L (ref <18)

## 2022-01-29 MED ORDER — DROPERIDOL 2.5 MG/ML IJ SOLN
2.5000 mg | Freq: Once | INTRAMUSCULAR | Status: AC
  Administered 2022-01-29: 2.5 mg via INTRAMUSCULAR
  Filled 2022-01-29: qty 2

## 2022-01-29 MED ORDER — POTASSIUM CHLORIDE CRYS ER 20 MEQ PO TBCR
40.0000 meq | EXTENDED_RELEASE_TABLET | Freq: Once | ORAL | Status: AC
  Administered 2022-01-29: 40 meq via ORAL
  Filled 2022-01-29: qty 2

## 2022-01-29 MED ORDER — KETOROLAC TROMETHAMINE 30 MG/ML IJ SOLN
30.0000 mg | Freq: Once | INTRAMUSCULAR | Status: AC
  Administered 2022-01-29: 30 mg via INTRAMUSCULAR
  Filled 2022-01-29: qty 1

## 2022-01-29 NOTE — ED Provider Notes (Signed)
[facility] Provider Note    Event Date/Time   First MD Initiated Contact with Patient 01/29/22 0002     (approximate)   History   No chief complaint on file.   HPI  Latasha Waters is a 47 y.o. female who presents to the ED for evaluation of No chief complaint on file.   I review PCP visit from 1/2. Obese patient with hx intermittent asthma, chronic atypical chest pain, low back pain and sciatica.   Patient presents to the ED for evaluation of multiple symptoms that began after a stressful day at work.  She describes at length a difficult situation at work causing a lot of stress.  After this, she reports developing a headache, "feeling flushed in the chest" and developing tingling going down her left arm.  Denies any falls or syncope.  Denies any weakness to the extremities.  Denies any gait changes or vision changes.  After getting home from work, she noted her blood pressure to be as elevated as systolic in the [address] "which is not normal for me" and so she presents to the ED with her son for evaluation.   Physical Exam   Triage Vital Signs: ED Triage Vitals  Enc Vitals Group     BP 01/28/22 2012 (!) 143/93     Pulse Rate 01/28/22 2012 71     Resp 01/28/22 2012 20     Temp 01/28/22 2012 98.1 F (36.7 C)     Temp Source 01/28/22 2012 Oral     SpO2 01/28/22 2012 99 %     Weight 01/28/22 2015 185 lb (83.9 kg)     Height 01/28/22 2015 5\' 4"  (1.626 m)     Head Circumference --      Peak Flow --      Pain Score 01/28/22 2013 8     Pain Loc --      Pain Edu? --      Excl. in [place]? --     Most recent vital signs: Vitals:   01/28/22 2012  BP: (!) 143/93  Pulse: 71  Resp: 20  Temp: 98.1 F (36.7 C)  SpO2: 99%    General: Awake, no distress.  Well-appearing, fluent and conversational. CV:  Good peripheral perfusion.  RRR without appreciable murmur Resp:  Normal effort.  CTAB Abd:  No distention.  Soft and benign MSK:  No deformity  noted.  Chest pain is reproducible palpation.  No overlying skin changes or signs of trauma. Neuro:  No focal deficits appreciated. Cranial nerves II through XII intact 5/5 strength and sensation in all 4 extremities Other:     ED Results / Procedures / Treatments   Labs (all labs ordered are listed, but only abnormal results are displayed) Labs Reviewed  COMPREHENSIVE METABOLIC PANEL - Abnormal; Notable for the following components:      Result Value   Potassium 3.3 (*)    Calcium 8.7 (*)    All other components within normal limits  CBC  POC URINE PREG, ED  TROPONIN I (HIGH SENSITIVITY)  TROPONIN I (HIGH SENSITIVITY)    EKG Sinus rhythm, rate of 70bpm, normal axis and intervals. No clear signs of acute ischemia.  RADIOLOGY CT head interpreted by me without evidence of acute intracranial pathology  Official radiology report(s): DG Chest 2 View  Result Date: 01/29/2022 CLINICAL DATA:  Chest and left arm pain. EXAM: CHEST - 2 VIEW COMPARISON:  October 30, 2021 FINDINGS: The heart size and  mediastinal contours are within normal limits. Both lungs are clear. The visualized skeletal structures are unremarkable. IMPRESSION: No active cardiopulmonary disease. Electronically Signed   By: Virgina Norfolk M.D.   On: 01/29/2022 00:39   CT Head Wo Contrast  Result Date: 01/28/2022 CLINICAL DATA:  Headache and left arm pain. EXAM: CT HEAD WITHOUT CONTRAST TECHNIQUE: Contiguous axial images were obtained from the base of the skull through the vertex without intravenous contrast. RADIATION DOSE REDUCTION: This exam was performed according to the departmental dose-optimization program which includes automated exposure control, adjustment of the mA and/or kV according to patient size and/or use of iterative reconstruction technique. COMPARISON:  01/22/2019 FINDINGS: Brain: No evidence of acute infarction, hemorrhage, hydrocephalus, extra-axial collection or mass lesion/mass effect. Vascular: No  hyperdense vessel or unexpected calcification. Skull: Normal. Negative for fracture or focal lesion. Sinuses/Orbits: Mucosal thickening in the paranasal sinuses. No acute air-fluid levels. Mastoid air cells are clear. Other: None. IMPRESSION: No acute intracranial abnormalities. Electronically Signed   By: Lucienne Capers M.D.   On: 01/28/2022 20:51    PROCEDURES and INTERVENTIONS:  .1-3 Lead EKG Interpretation  Performed by: Vladimir Crofts, MD Authorized by: Vladimir Crofts, MD     Interpretation: normal     ECG rate:  70   ECG rate assessment: normal     Rhythm: sinus rhythm     Ectopy: none     Conduction: normal     Medications  potassium chloride SA (KLOR-CON M) CR tablet 40 mEq (40 mEq Oral Given 01/29/22 0041)  ketorolac (TORADOL) 30 MG/ML injection 30 mg (30 mg Intramuscular Given 01/29/22 0040)  droperidol (INAPSINE) 2.5 MG/ML injection 2.5 mg (2.5 mg Intramuscular Given 01/29/22 0040)     IMPRESSION / MDM / ASSESSMENT AND PLAN / ED COURSE  I reviewed the triage vital signs and the nursing notes.  Differential diagnosis includes, but is not limited to, ACS, PTX, PNA, muscle strain/spasm, PE, dissection  {Patient presents with symptoms of an acute illness or injury that is potentially life-threatening.  47 year old female presents to the ED with atypical chest discomfort and headache after psychosocial stressors.  She looks well to me with an essentially normal exam.  Has some reproducible chest pain, otherwise reassuring without neurologic deficits.  Mild hypokalemia is noted on her blood work.  Normal CBC, 2 negative troponins and on ischemic EKG.  Clear CXR and CT head.  We will medicate and reassess.    Clinical Course as of 01/29/22 0602  Sat Jan 29, 2022  0121 Reassessed.  Feeling better.  We discussed reassuring workup.  Discussed possible etiologies of symptoms.  Discussed return precautions. [DS]    Clinical Course User Index [DS] Vladimir Crofts, MD     FINAL  CLINICAL IMPRESSION(S) / ED DIAGNOSES   Final diagnoses:  Other chest pain  Hypokalemia  Bad headache  Stress reaction     Rx / DC Orders   ED Discharge Orders     None        Note:  This document was prepared using Dragon voice recognition software and may include unintentional dictation errors.   Vladimir Crofts, MD 01/29/22 [phone]

## 2022-01-29 NOTE — ED Notes (Signed)
ED Provider at bedside. 

## 2022-01-29 NOTE — Discharge Instructions (Signed)
Please take Tylenol and ibuprofen/Advil for your pain.  It is safe to take them together, or to alternate them every few hours.  Take up to 1000mg of Tylenol at a time, up to 4 times per day.  Do not take more than 4000 mg of Tylenol in 24 hours.  For ibuprofen, take 400-600 mg, 3 - 4 times per day.  

## 2023-04-07 ENCOUNTER — Other Ambulatory Visit: Payer: Self-pay | Admitting: Neurology

## 2023-04-07 DIAGNOSIS — R519 Headache, unspecified: Secondary | ICD-10-CM

## 2023-04-14 ENCOUNTER — Ambulatory Visit
Admission: RE | Admit: 2023-04-14 | Discharge: 2023-04-14 | Disposition: A | Discharge status: Home / Self Care | Source: Ambulatory Visit | Attending: Neurology | Admitting: Neurology

## 2023-04-14 DIAGNOSIS — R519 Headache, unspecified: Secondary | ICD-10-CM

## 2023-04-15 ENCOUNTER — Ambulatory Visit
Admission: RE | Admit: 2023-04-15 | Discharge: 2023-04-15 | Disposition: A | Discharge status: Home / Self Care | Source: Ambulatory Visit | Attending: Neurology | Admitting: Neurology

## 2023-04-15 DIAGNOSIS — R519 Headache, unspecified: Secondary | ICD-10-CM | POA: Diagnosis present

## 2023-09-08 ENCOUNTER — Encounter
Admission: RE | Disposition: A | Discharge status: Home / Self Care | Payer: Self-pay | Source: Home / Self Care | Attending: Gastroenterology

## 2023-09-08 ENCOUNTER — Encounter: Payer: Self-pay | Admitting: Gastroenterology

## 2023-09-08 ENCOUNTER — Ambulatory Visit: Admitting: Certified Registered Nurse Anesthetist

## 2023-09-08 ENCOUNTER — Other Ambulatory Visit: Payer: Self-pay

## 2023-09-08 ENCOUNTER — Ambulatory Visit
Admission: RE | Admit: 2023-09-08 | Discharge: 2023-09-08 | Disposition: A | Discharge status: Home / Self Care | Attending: Gastroenterology | Admitting: Gastroenterology

## 2023-09-08 DIAGNOSIS — R1319 Other dysphagia: Secondary | ICD-10-CM | POA: Diagnosis not present

## 2023-09-08 DIAGNOSIS — K635 Polyp of colon: Secondary | ICD-10-CM | POA: Diagnosis not present

## 2023-09-08 DIAGNOSIS — F419 Anxiety disorder, unspecified: Secondary | ICD-10-CM | POA: Diagnosis not present

## 2023-09-08 DIAGNOSIS — E66813 Obesity, class 3: Secondary | ICD-10-CM | POA: Insufficient documentation

## 2023-09-08 DIAGNOSIS — D124 Benign neoplasm of descending colon: Secondary | ICD-10-CM | POA: Insufficient documentation

## 2023-09-08 DIAGNOSIS — R519 Headache, unspecified: Secondary | ICD-10-CM | POA: Insufficient documentation

## 2023-09-08 DIAGNOSIS — Z1211 Encounter for screening for malignant neoplasm of colon: Secondary | ICD-10-CM | POA: Diagnosis present

## 2023-09-08 DIAGNOSIS — Z79899 Other long term (current) drug therapy: Secondary | ICD-10-CM | POA: Diagnosis not present

## 2023-09-08 DIAGNOSIS — R0789 Other chest pain: Secondary | ICD-10-CM | POA: Insufficient documentation

## 2023-09-08 DIAGNOSIS — K219 Gastro-esophageal reflux disease without esophagitis: Secondary | ICD-10-CM | POA: Insufficient documentation

## 2023-09-08 DIAGNOSIS — K2970 Gastritis, unspecified, without bleeding: Secondary | ICD-10-CM | POA: Diagnosis not present

## 2023-09-08 DIAGNOSIS — J45909 Unspecified asthma, uncomplicated: Secondary | ICD-10-CM | POA: Insufficient documentation

## 2023-09-08 DIAGNOSIS — D120 Benign neoplasm of cecum: Secondary | ICD-10-CM | POA: Insufficient documentation

## 2023-09-08 DIAGNOSIS — B9681 Helicobacter pylori [H. pylori] as the cause of diseases classified elsewhere: Secondary | ICD-10-CM | POA: Diagnosis not present

## 2023-09-08 DIAGNOSIS — K5900 Constipation, unspecified: Secondary | ICD-10-CM | POA: Insufficient documentation

## 2023-09-08 DIAGNOSIS — Z6832 Body mass index (BMI) 32.0-32.9, adult: Secondary | ICD-10-CM | POA: Diagnosis not present

## 2023-09-08 HISTORY — DX: Sleep apnea, unspecified: G47.30

## 2023-09-08 HISTORY — PX: COLONOSCOPY: SHX5424

## 2023-09-08 HISTORY — PX: POLYPECTOMY: SHX149

## 2023-09-08 HISTORY — PX: ESOPHAGOGASTRODUODENOSCOPY: SHX5428

## 2023-09-08 LAB — POCT PREGNANCY, URINE: Preg Test, Ur: NEGATIVE

## 2023-09-08 SURGERY — COLONOSCOPY
Anesthesia: General

## 2023-09-08 MED ORDER — SODIUM CHLORIDE 0.9 % IV SOLN: INTRAVENOUS | Status: DC

## 2023-09-08 MED ORDER — LIDOCAINE HCL (CARDIAC) PF 100 MG/5ML IV SOSY
PREFILLED_SYRINGE | INTRAVENOUS | Status: DC | PRN
  Administered 2023-09-08: 50 mg via INTRAVENOUS

## 2023-09-08 MED ORDER — PROPOFOL 10 MG/ML IV BOLUS
INTRAVENOUS | Status: DC | PRN
  Administered 2023-09-08: 150 ug/kg/min via INTRAVENOUS
  Administered 2023-09-08: 80 mg via INTRAVENOUS

## 2023-09-08 MED ORDER — GLYCOPYRROLATE 0.2 MG/ML IJ SOLN
INTRAMUSCULAR | Status: DC | PRN
  Administered 2023-09-08: .2 mg via INTRAVENOUS

## 2023-09-08 MED ORDER — DEXMEDETOMIDINE HCL IN NACL 80 MCG/20ML IV SOLN
INTRAVENOUS | Status: DC | PRN
  Administered 2023-09-08: 8 ug via INTRAVENOUS

## 2023-09-08 NOTE — Op Note (Signed)
 [facility] Gastroenterology Patient Name: Latasha Waters Procedure Date: 09/08/2023 9:53 AM MRN: [id number] Account #: [id number] Date of Birth: 1975/03/18 Admit Type: Outpatient Age: 48 Room: [facility] ENDO ROOM 1 Gender: Female Note Status: Finalized Instrument Name: Colon Scope [id number] Procedure:             Colonoscopy Indications:           Screening for colorectal malignant neoplasm Providers:             Elspeth Ozell Onita ROSALEA, DO Referring MD:          Bette BRAVO. Eliverto, MD (Referring MD) Medicines:             Monitored Anesthesia Care Complications:         No immediate complications. Estimated blood loss:                         Minimal. Procedure:             Pre-Anesthesia Assessment:                        - Prior to the procedure, a History and Physical was                         performed, and patient medications and allergies were                         reviewed. The patient is competent. The risks and                         benefits of the procedure and the sedation options and                         risks were discussed with the patient. All questions                         were answered and informed consent was obtained.                         Patient identification and proposed procedure were                         verified by the physician, the nurse, the anesthetist                         and the technician in the endoscopy suite. Mental                         Status Examination: alert and oriented. Airway                         Examination: normal oropharyngeal airway and neck                         mobility. Respiratory Examination: clear to                         auscultation. CV Examination: RRR, no murmurs, no S3  or S4. Prophylactic Antibiotics: The patient does not                         require prophylactic antibiotics. Prior                         Anticoagulants: The patient has taken no anticoagulant                          or antiplatelet agents. ASA Grade Assessment: II - A                         patient with mild systemic disease. After reviewing                         the risks and benefits, the patient was deemed in                         satisfactory condition to undergo the procedure. The                         anesthesia plan was to use monitored anesthesia care                         (MAC). Immediately prior to administration of                         medications, the patient was re-assessed for adequacy                         to receive sedatives. The heart rate, respiratory                         rate, oxygen saturations, blood pressure, adequacy of                         pulmonary ventilation, and response to care were                         monitored throughout the procedure. The physical                         status of the patient was re-assessed after the                         procedure.                        After obtaining informed consent, the colonoscope was                         passed under direct vision. Throughout the procedure,                         the patient's blood pressure, pulse, and oxygen                         saturations were monitored continuously. The  Colonoscope was introduced through the anus and                         advanced to the the cecum, identified by appendiceal                         orifice and ileocecal valve. The colonoscopy was                         performed without difficulty. The patient tolerated                         the procedure well. The quality of the bowel                         preparation was evaluated using the BBPS [facility] Bowel                         Preparation Scale) with scores of: Right Colon = 2                         (minor amount of residual staining, small fragments of                         stool and/or opaque liquid, but mucosa seen well),                          Transverse Colon = 3 (entire mucosa seen well with no                         residual staining, small fragments of stool or opaque                         liquid) and Left Colon = 3 (entire mucosa seen well                         with no residual staining, small fragments of stool or                         opaque liquid). The total BBPS score equals 8. The                         quality of the bowel preparation was excellent. The                         ileocecal valve, appendiceal orifice, and rectum were                         photographed. Findings:      The perianal and digital rectal examinations were normal. Pertinent       negatives include normal sphincter tone.      Five sessile polyps were found in the transverse colon and cecum. The       polyps were 1 to 2 mm in size. These polyps were removed with a jumbo       cold forceps. Resection and retrieval were complete. Estimated blood  loss was minimal.      Two sessile polyps were found in the descending colon and transverse       colon. The polyps were 3 to 8 mm in size. These polyps were removed with       a cold snare. Resection and retrieval were complete. Estimated blood       loss was minimal.      The exam was otherwise without abnormality on direct and retroflexion       views. Impression:            - Five 1 to 2 mm polyps in the transverse colon and in                         the cecum, removed with a jumbo cold forceps. Resected                         and retrieved.                        - Two 3 to 8 mm polyps in the descending colon and in                         the transverse colon, removed with a cold snare.                         Resected and retrieved.                        - The examination was otherwise normal on direct and                         retroflexion views. Recommendation:        - Patient has a contact number available for                         emergencies. The signs and  symptoms of potential                         delayed complications were discussed with the patient.                         Return to normal activities tomorrow. Written                         discharge instructions were provided to the patient.                        - Discharge patient to home.                        - Resume previous diet.                        - Continue present medications.                        - No ibuprofen , naproxen , or other non-steroidal  anti-inflammatory drugs for 5 days after polyp removal.                        - Await pathology results.                        - Repeat colonoscopy for surveillance based on                         pathology results.                        - Return to GI office as previously scheduled.                        - The findings and recommendations were discussed with                         the patient. Procedure Code(s):     --- Professional ---                        [phone], Colonoscopy, flexible; with removal of                         tumor(s), polyp(s), or other lesion(s) by snare                         technique                        45380, 59, Colonoscopy, flexible; with biopsy, single                         or multiple Diagnosis Code(s):     --- Professional ---                        Z12.11, Encounter for screening for malignant neoplasm                         of colon                        D12.0, Benign neoplasm of cecum                        D12.4, Benign neoplasm of descending colon                        D12.3, Benign neoplasm of transverse colon (hepatic                         flexure or splenic flexure) CPT copyright 2022 American Medical Association. All rights reserved. The codes documented in this report are preliminary and upon coder review may  be revised to meet current compliance requirements. Attending Participation:      I personally performed the entire  procedure. Elspeth Jungling, DO Elspeth Ozell Jungling DO, DO 09/08/2023 10:46:36 AM This report has been signed electronically. Number of Addenda: 0 Note Initiated On: 09/08/2023 9:53 AM Scope Withdrawal Time: 0 hours 14 minutes 48 seconds  Total Procedure Duration: 0 hours 18 minutes 38 seconds  Estimated Blood Loss:  Estimated blood loss was minimal.      [facility]

## 2023-09-08 NOTE — Op Note (Signed)
 [facility] Gastroenterology Patient Name: Latasha Waters Procedure Date: 09/08/2023 9:54 AM MRN: [id number] Account #: [id number] Date of Birth: 1975-08-15 Admit Type: Outpatient Age: 48 Room: [facility] ENDO ROOM 1 Gender: Female Note Status: Finalized Instrument Name: [facility]ie GI Scope [id number] Procedure:             Upper GI endoscopy Indications:           GERD, atypical chest pain, pill dysphagia Providers:             Elspeth Ozell Onita ROSALEA, DO Referring MD:          Bette BRAVO. Eliverto, MD (Referring MD) Medicines:             Monitored Anesthesia Care Complications:         No immediate complications. Estimated blood loss:                         Minimal. Procedure:             Pre-Anesthesia Assessment:                        - Prior to the procedure, a History and Physical was                         performed, and patient medications and allergies were                         reviewed. The patient is competent. The risks and                         benefits of the procedure and the sedation options and                         risks were discussed with the patient. All questions                         were answered and informed consent was obtained.                         Patient identification and proposed procedure were                         verified by the physician, the nurse, the anesthetist                         and the technician in the endoscopy suite. Mental                         Status Examination: alert and oriented. Airway                         Examination: normal oropharyngeal airway and neck                         mobility. Respiratory Examination: clear to                         auscultation. CV Examination: RRR, no murmurs, no S3  or S4. Prophylactic Antibiotics: The patient does not                         require prophylactic antibiotics. Prior                         Anticoagulants: The patient has taken no  anticoagulant                         or antiplatelet agents. ASA Grade Assessment: II - A                         patient with mild systemic disease. After reviewing                         the risks and benefits, the patient was deemed in                         satisfactory condition to undergo the procedure. The                         anesthesia plan was to use monitored anesthesia care                         (MAC). Immediately prior to administration of                         medications, the patient was re-assessed for adequacy                         to receive sedatives. The heart rate, respiratory                         rate, oxygen saturations, blood pressure, adequacy of                         pulmonary ventilation, and response to care were                         monitored throughout the procedure. The physical                         status of the patient was re-assessed after the                         procedure.                        After obtaining informed consent, the endoscope was                         passed under direct vision. Throughout the procedure,                         the patient's blood pressure, pulse, and oxygen                         saturations were monitored continuously. The Endoscope  was introduced through the mouth, and advanced to the                         second part of duodenum. The upper GI endoscopy was                         accomplished without difficulty. The patient tolerated                         the procedure well. Findings:      The duodenal bulb, first portion of the duodenum and second portion of       the duodenum were normal. Biopsies for histology were taken with a cold       forceps for evaluation of celiac disease. Estimated blood loss was       minimal.      The entire examined stomach was normal. Biopsies were taken with a cold       forceps for Helicobacter pylori testing. Estimated blood  loss was       minimal.      The Z-line was variable. tongue of salmon colored mucosa extending ~1cm       above the z line. Biopsies were taken with a cold forceps for histology.       Estimated blood loss was minimal.      No endoscopic abnormality was evident in the esophagus to explain the       patient's complaint of dysphagia. Biopsies were obtained from the       proximal and distal esophagus with cold forceps for histology of       suspected eosinophilic esophagitis. Estimated blood loss was minimal.      The exam of the esophagus was otherwise normal. Impression:            - Normal duodenal bulb, first portion of the duodenum                         and second portion of the duodenum. Biopsied.                        - Normal stomach. Biopsied.                        - Z-line variable. Biopsied.                        - No endoscopic esophageal abnormality to explain                         patient's dysphagia.                        - Biopsies were taken with a cold forceps for                         evaluation of eosinophilic esophagitis. Recommendation:        - Patient has a contact number available for                         emergencies. The signs and symptoms of potential  delayed complications were discussed with the patient.                         Return to normal activities tomorrow. Written                         discharge instructions were provided to the patient.                        - Discharge patient to home.                        - Resume previous diet ; recommend FODMAP diet for                         bloating.                        - Continue present medications.                        - continue proton pump inhibitor therapy.                        consider treatment for functional dyspepsia.                        - Await pathology results.                        - Return to GI clinic as previously scheduled.                         - The findings and recommendations were discussed with                         the patient. Procedure Code(s):     --- Professional ---                        [phone], Esophagogastroduodenoscopy, flexible,                         transoral; with biopsy, single or multiple Diagnosis Code(s):     --- Professional ---                        K22.89, Other specified disease of esophagus                        R13.10, Dysphagia, unspecified CPT copyright 2022 American Medical Association. All rights reserved. The codes documented in this report are preliminary and upon coder review may  be revised to meet current compliance requirements. Attending Participation:      I personally performed the entire procedure. Elspeth Jungling, DO Elspeth Ozell Jungling DO, DO 09/08/2023 10:19:29 AM This report has been signed electronically. Number of Addenda: 0 Note Initiated On: 09/08/2023 9:54 AM Estimated Blood Loss:  Estimated blood loss was minimal.      [facility]
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
## 2023-09-08 NOTE — Transfer of Care (Signed)
 Immediate Anesthesia Transfer of Care Note  Patient: Latasha Waters  Procedure(s) Performed: COLONOSCOPY EGD (ESOPHAGOGASTRODUODENOSCOPY) POLYPECTOMY, INTESTINE  Patient Location: PACU  Anesthesia Type:General  Level of Consciousness: awake and drowsy  Airway & Oxygen Therapy: Patient Spontanous Breathing  Post-op Assessment: Report given to RN and Post -op Vital signs reviewed and stable  Post vital signs: Reviewed and stable  Last Vitals:  Vitals Value Taken Time  BP 114/80 09/08/23 10:46  Temp 35.7 C 09/08/23 10:46  Pulse 83 09/08/23 10:46  Resp    SpO2 100 % 09/08/23 10:46    Last Pain:  Vitals:   09/08/23 1046  TempSrc: Tympanic  PainSc: Asleep         Complications: No notable events documented.
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
## 2023-09-08 NOTE — Anesthesia Procedure Notes (Signed)
 Date/Time: 09/08/2023 10:04 AM  Performed by: Dominica Krabbe, CRNAPre-anesthesia Checklist: Patient identified, Emergency Drugs available, Patient being monitored, Suction available and Timeout performed Patient Re-evaluated:Patient Re-evaluated prior to induction Oxygen Delivery Method: Nasal cannula Preoxygenation: Pre-oxygenation with 100% oxygen Induction Type: IV induction

## 2023-09-08 NOTE — Interval H&P Note (Signed)
 History and Physical Interval Note: Preprocedure H&P from 09/08/23  was reviewed and there was no interval change after seeing and examining the patient.  Written consent was obtained from the patient after discussion of risks, benefits, and alternatives. Patient has consented to proceed with Esophagogastroduodenoscopy and Colonoscopy with possible intervention   09/08/2023 9:57 AM  Latasha Waters  has presented today for surgery, with the diagnosis of R07.89 (ICD-10-CM) - Atypical chest pain R13.10 (ICD-10-CM) - Pill dysphagia K21.9 (ICD-10-CM) - Gastroesophageal reflux disease, unspecified whether esophagitis present Z12.11 (ICD-10-CM) - Colon cancer screening.  The various methods of treatment have been discussed with the patient and family. After consideration of risks, benefits and other options for treatment, the patient has consented to  Procedure(s) with comments: COLONOSCOPY (N/A) EGD (ESOPHAGOGASTRODUODENOSCOPY) (N/A) - DOES NOT NEED INTERPRETER as a surgical intervention.  The patient's history has been reviewed, patient examined, no change in status, stable for surgery.  I have reviewed the patient's chart and labs.  Questions were answered to the patient's satisfaction.     Elspeth Ozell Jungling

## 2023-09-08 NOTE — Anesthesia Postprocedure Evaluation (Signed)
 Anesthesia Post Note  Patient: Latasha Waters  Procedure(s) Performed: COLONOSCOPY EGD (ESOPHAGOGASTRODUODENOSCOPY) POLYPECTOMY, INTESTINE  Patient location during evaluation: PACU Anesthesia Type: General Level of consciousness: awake and alert Pain management: pain level controlled Vital Signs Assessment: post-procedure vital signs reviewed and stable Respiratory status: spontaneous breathing Cardiovascular status: stable Anesthetic complications: no   No notable events documented.   Last Vitals:  Vitals:   09/08/23 1056 09/08/23 1105  BP: 110/81 114/84  Pulse: (!) 56 (!) 55  Resp: 16 16  Temp:    SpO2: 100% 100%    Last Pain:  Vitals:   09/08/23 1056  TempSrc:   PainSc: 0-No pain                 VAN STAVEREN,Jenny Lai

## 2023-09-12 LAB — SURGICAL PATHOLOGY

## 2023-11-17 ENCOUNTER — Ambulatory Visit

## 2024-01-14 IMAGING — CR DG HAND COMPLETE 3+V*L*
3 series · 3 of 3 positions shown · non-contrast
Comparison: None.

CLINICAL DATA: Left hand pain, MCP joint pain

EXAM:
LEFT HAND - COMPLETE 3+ VIEW

[hand ap]
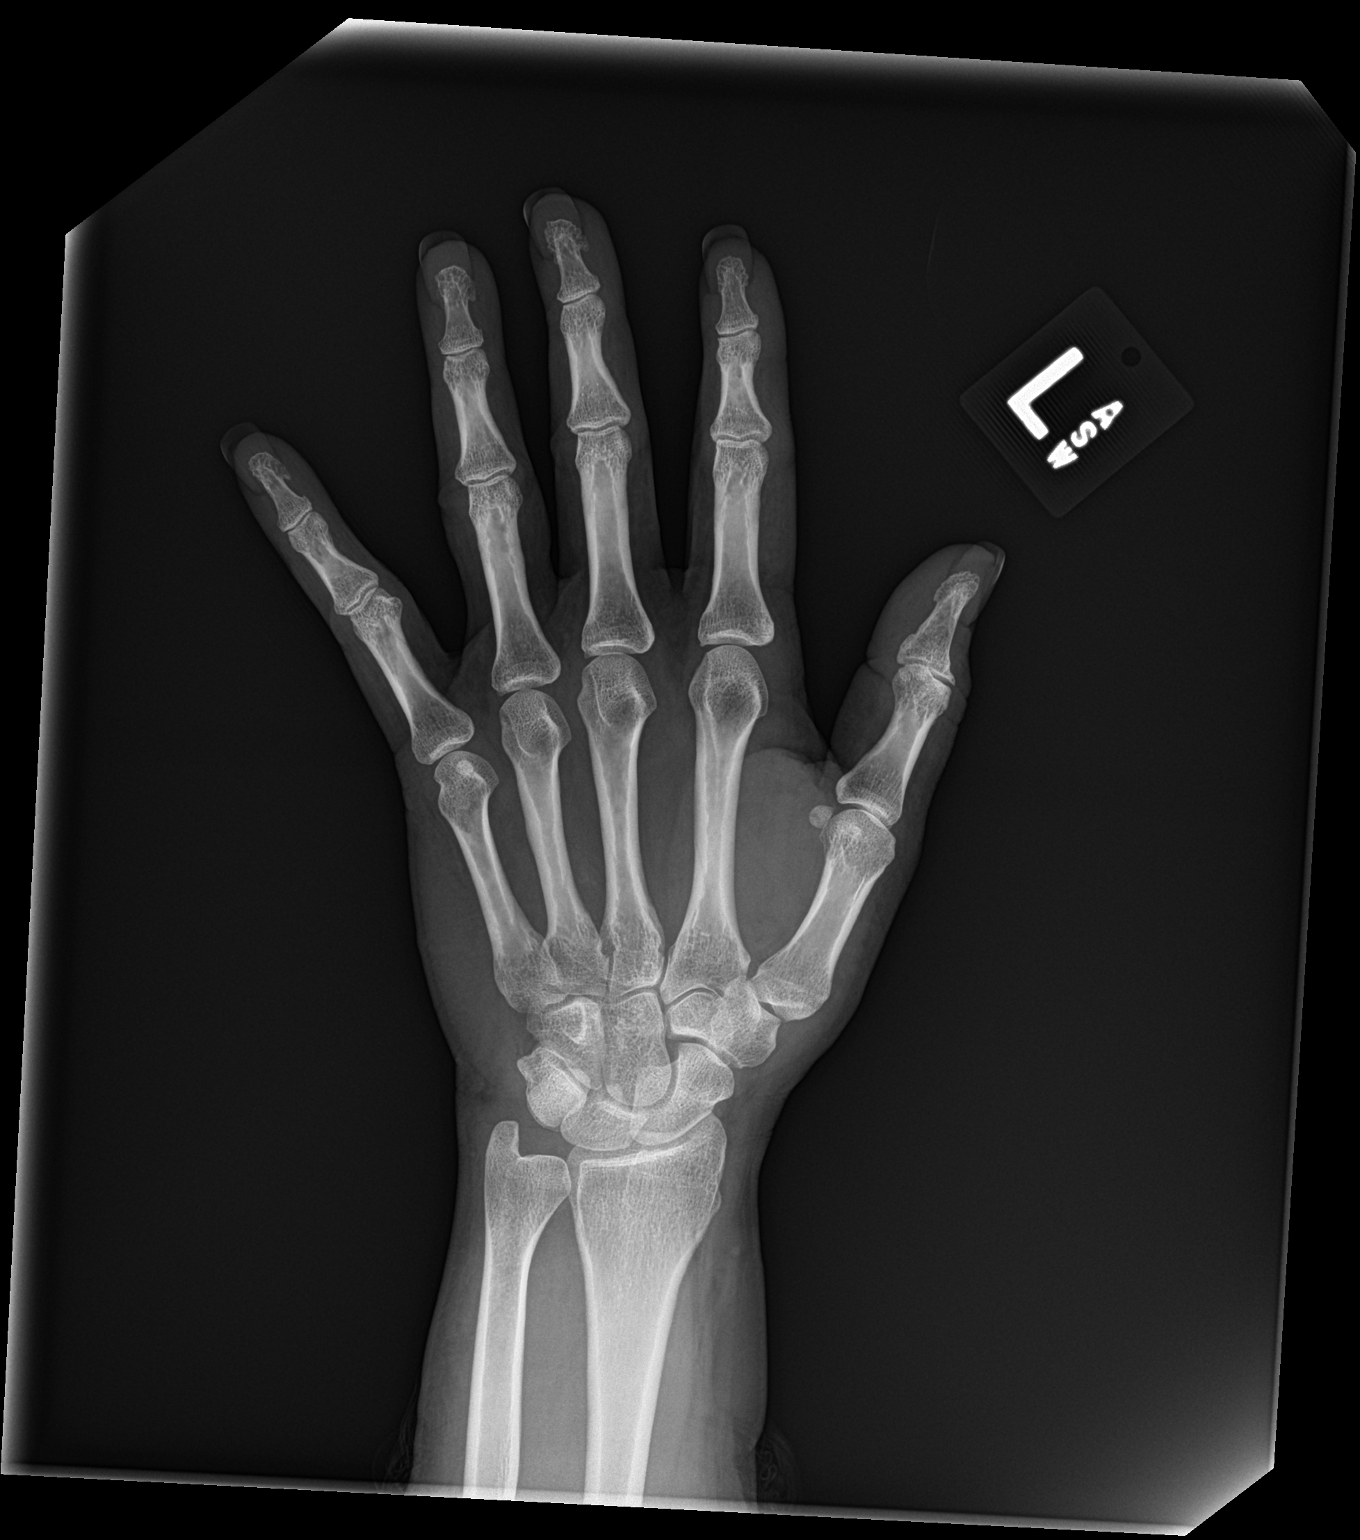

[hand obl]
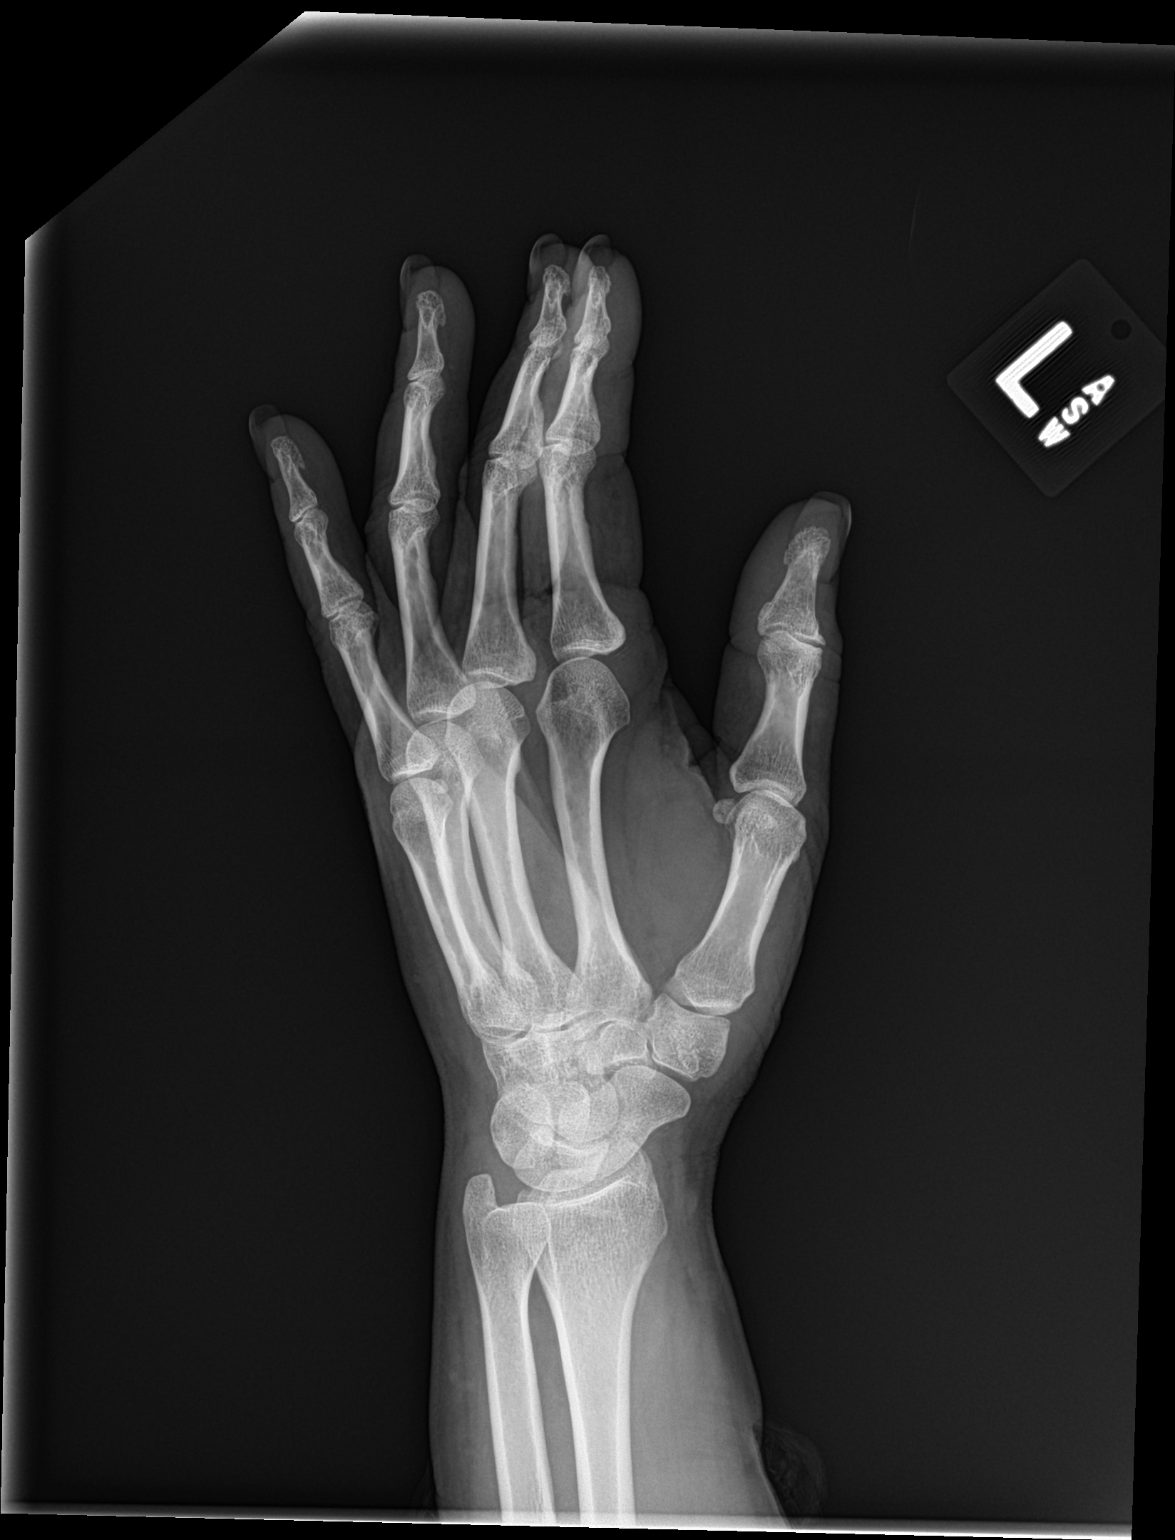

[hand lat]
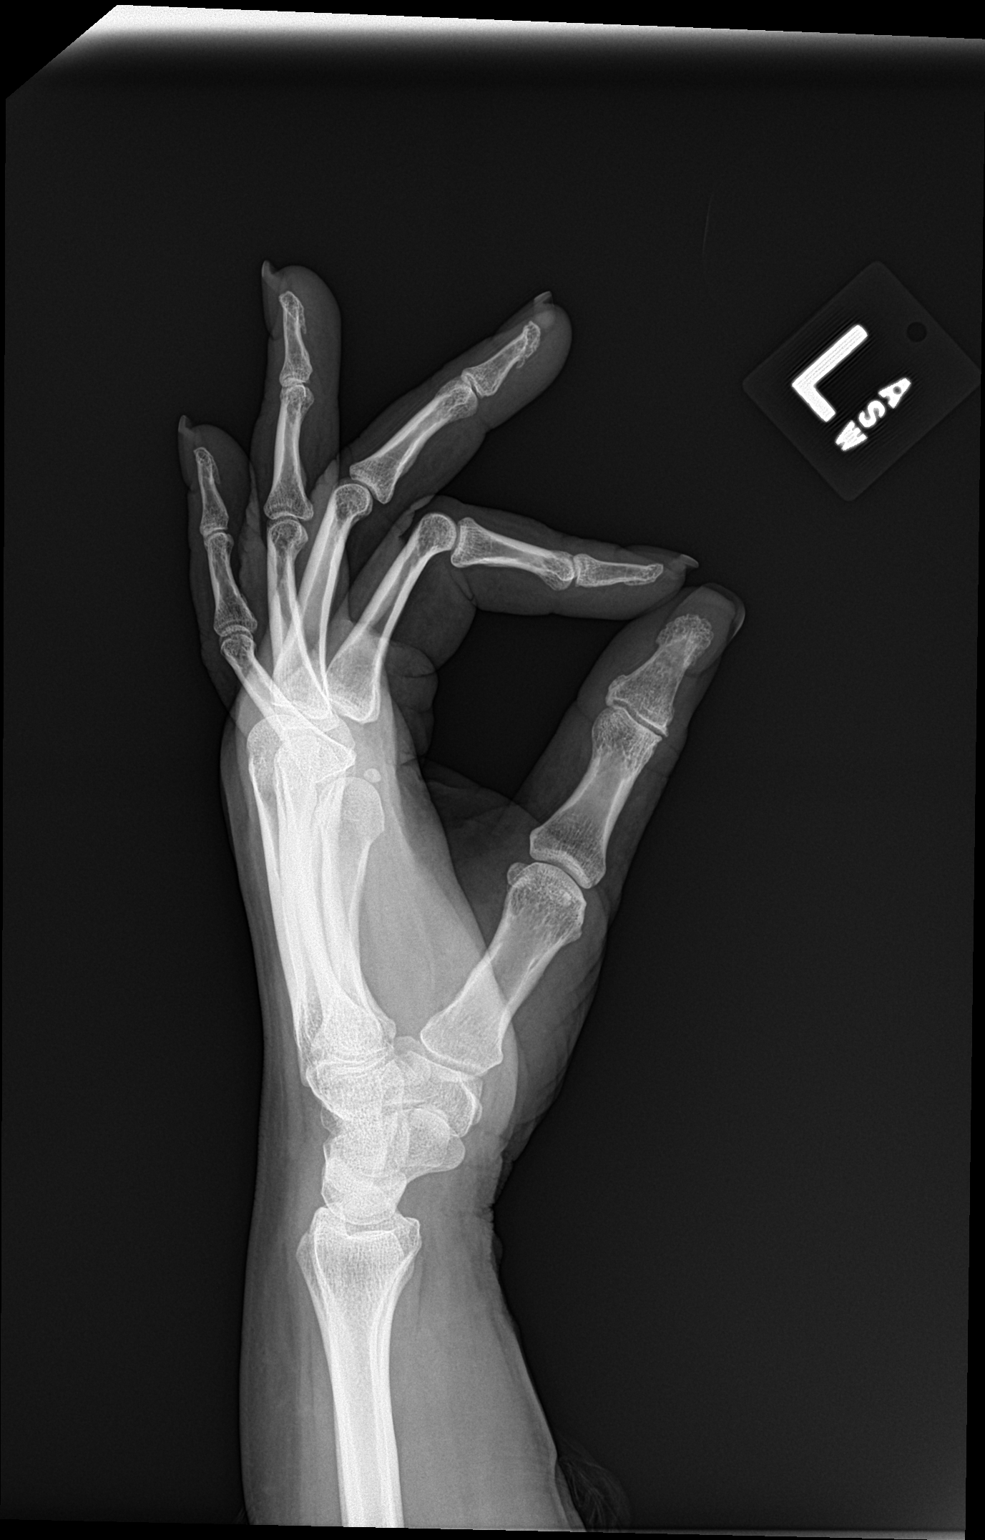

[3 of 3 positions shown; findings below may reference images not displayed]

FINDINGS: There is no evidence of fracture or dislocation. There is no
evidence of arthropathy or other focal bone abnormality. Soft
tissues are unremarkable.
IMPRESSION: Negative.
